# Patient Record
Sex: Female | Born: 1965 | Race: White | Hispanic: No | Marital: Married | Smoking: Former smoker
Health system: Southern US, Community
[De-identification: ages and names within clinical notes are randomized; demographics above are authoritative.]

## PROBLEM LIST (undated history)

## (undated) DIAGNOSIS — F99 Mental disorder, not otherwise specified: Secondary | ICD-10-CM

## (undated) DIAGNOSIS — D250 Submucous leiomyoma of uterus: Secondary | ICD-10-CM

## (undated) DIAGNOSIS — N950 Postmenopausal bleeding: Secondary | ICD-10-CM

## (undated) DIAGNOSIS — K8020 Calculus of gallbladder without cholecystitis without obstruction: Secondary | ICD-10-CM

## (undated) DIAGNOSIS — H04129 Dry eye syndrome of unspecified lacrimal gland: Secondary | ICD-10-CM

## (undated) HISTORY — PX: TONSILLECTOMY: SUR1361

## (undated) HISTORY — DX: Mental disorder, not otherwise specified: F99

## (undated) HISTORY — DX: Calculus of gallbladder without cholecystitis without obstruction: K80.20

## (undated) HISTORY — DX: Submucous leiomyoma of uterus: D25.0

## (undated) HISTORY — DX: Postmenopausal bleeding: N95.0

---
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
## 2007-03-06 ENCOUNTER — Ambulatory Visit: Payer: Self-pay | Admitting: Orthopedic Surgery

## 2007-12-28 ENCOUNTER — Encounter: Admission: RE | Admit: 2007-12-28 | Discharge: 2007-12-28 | Payer: Self-pay | Admitting: Gastroenterology

## 2008-01-10 ENCOUNTER — Other Ambulatory Visit: Admission: RE | Admit: 2008-01-10 | Discharge: 2008-01-10 | Payer: Self-pay | Admitting: Obstetrics and Gynecology

## 2009-02-10 ENCOUNTER — Other Ambulatory Visit: Admission: RE | Admit: 2009-02-10 | Discharge: 2009-02-10 | Payer: Self-pay | Admitting: Obstetrics and Gynecology

## 2010-04-29 ENCOUNTER — Other Ambulatory Visit: Admission: RE | Admit: 2010-04-29 | Discharge: 2010-04-29 | Payer: Self-pay | Admitting: Obstetrics and Gynecology

## 2011-06-28 ENCOUNTER — Ambulatory Visit (INDEPENDENT_AMBULATORY_CARE_PROVIDER_SITE_OTHER): Payer: BC Managed Care – PPO | Admitting: General Surgery

## 2011-06-28 ENCOUNTER — Other Ambulatory Visit (INDEPENDENT_AMBULATORY_CARE_PROVIDER_SITE_OTHER): Payer: Self-pay | Admitting: General Surgery

## 2011-06-28 ENCOUNTER — Encounter (INDEPENDENT_AMBULATORY_CARE_PROVIDER_SITE_OTHER): Payer: Self-pay | Admitting: General Surgery

## 2011-06-28 VITALS — BP 128/86 | HR 64 | Temp 97.0°F | Resp 12 | Ht 62.0 in | Wt 151.0 lb

## 2011-06-28 DIAGNOSIS — K8020 Calculus of gallbladder without cholecystitis without obstruction: Secondary | ICD-10-CM

## 2011-06-28 NOTE — Progress Notes (Signed)
Patient ID: Samantha Vincent, female   DOB: 04/11/1966, 46 y.o.   MRN: [id number]  Chief Complaint  Patient presents with  . Other    new pt- eval GB      HPI Samantha Vincent is a 46 y.o. female.  She is referred by Dr. Sharrell Ku for consideration of elective cholecystectomy. Her primary care physician is Dr. Shaune Pollack in [place].  The patient began having episodes of biliary colic in 2009. She characterizes these episodes as epigastric pain which radiates to her back and this was associated nausea and vomiting. Sometimes she gets diarrhea. Between attacks her bowel function is normal. She denies food intolerance. She denies relationship to meals. She has never had any liver disease or abnormal liver function tests.  She was initially evaluated by Dr. Kinnie Scales in 2009 and underwent a gallbladder ultrasound on December 28, 2007. This showed multiple gallstones the largest being 2 cm. Fatty liver was noted. She had an upper endoscopy in 2009 she reports was normal.  She is asymptomatic today. She had a very severe attack recently which lasted one hour and  which was longer than the previous attacks. Dr. Kinnie Scales apparently told her that she was going to continue to have attacks she decided to consider surgical intervention at this point. HPI  Past Medical History  Diagnosis Date  . Gallstones     Past Surgical History  Procedure Date  . Tonsillectomy 1971    Family History  Problem Relation Age of Onset  . Heart disease Father     Social History History  Substance Use Topics  . Smoking status: Former Games developer  . Smokeless tobacco: Not on file   Comment: smoked in college  . Alcohol Use: Yes    No Known Allergies  Current Outpatient Prescriptions  Medication Sig Dispense Refill  . Calcium Carbonate-Vit D-Min (CALTRATE PLUS PO) Take by mouth daily.        . fish oil-omega-3 fatty acids 1000 MG capsule Take 2 g by mouth daily.        Marland Kitchen FLUoxetine (PROZAC) 20 MG tablet Take 20 mg by  mouth daily.        Marland Kitchen ibuprofen (ADVIL,MOTRIN) 200 MG tablet Take 200 mg by mouth every 6 (six) hours as needed.          Review of Systems Review of Systems  Constitutional: Negative for fever, chills and unexpected weight change.  HENT: Negative for hearing loss, congestion, sore throat, trouble swallowing and voice change.   Eyes: Negative for visual disturbance.  Respiratory: Negative for cough and wheezing.   Cardiovascular: Negative for chest pain, palpitations and leg swelling.  Gastrointestinal: Positive for nausea, vomiting and abdominal pain. Negative for diarrhea, constipation, blood in stool, abdominal distention and anal bleeding.  Genitourinary: Negative for hematuria, vaginal bleeding and difficulty urinating.  Musculoskeletal: Negative for arthralgias.  Skin: Negative for rash and wound.  Neurological: Negative for seizures, syncope and headaches.  Hematological: Negative for adenopathy. Does not bruise/bleed easily.  Psychiatric/Behavioral: Negative for confusion.    Blood pressure 128/86, pulse 64, temperature 97 F (36.1 C), temperature source Temporal, resp. rate 12, height 5\' 2"  (1.575 m), weight 151 lb (68.493 kg).  Physical Exam Physical Exam  Constitutional: She is oriented to person, place, and time. She appears well-developed and well-nourished. No distress.  HENT:  Head: Normocephalic and atraumatic.  Nose: Nose normal.  Mouth/Throat: No oropharyngeal exudate.  Eyes: Conjunctivae and EOM are normal. Pupils are equal, round, and reactive  to light. Left eye exhibits no discharge. No scleral icterus.  Neck: Neck supple. No JVD present. No tracheal deviation present. No thyromegaly present.  Cardiovascular: Normal rate, regular rhythm, normal heart sounds and intact distal pulses.   No murmur heard. Pulmonary/Chest: Effort normal and breath sounds normal. No respiratory distress. She has no wheezes. She has no rales. She exhibits no tenderness.  Abdominal:  Soft. Bowel sounds are normal. She exhibits no distension and no mass. There is no tenderness. There is no rebound and no guarding.  Musculoskeletal: She exhibits no edema and no tenderness.  Lymphadenopathy:    She has no cervical adenopathy.  Neurological: She is alert and oriented to person, place, and time. She exhibits normal muscle tone. Coordination normal.  Skin: Skin is warm. No rash noted. She is not diaphoretic. No erythema. No pallor.  Psychiatric: She has a normal mood and affect. Her behavior is normal. Judgment and thought content normal.    Data Reviewed  I have reviewed her ultrasound report and I have reviewed Dr. Marlyce Huge recent office note. Assessment    Chronic cholecystitis with cholelithiasis. I agree that she is having recurrent and accelerating biliary colic. No apparent complications to date.    Plan    Proceed with scheduling of laparoscopic cholecystectomy with cholangiogram.  I have discussed the indications and details of surgery with her. Risks and complications have been outlined, including but not limited to bleeding, infection, conversion to open laparotomy, bile leak, injury to adjacent organs with major reconstructive surgery, cardiac, pulmonary, and thromboembolic problems. She understands these issues. Her questions were answered. She agrees with this plan.  Angelia Mould. Derrell Lolling, M.D., [facility] General and Minimally invasive Surgery Breast and Colorectal Surgery Office:   [phone] Pager:   [phone]        Ernestene Mention 06/28/2011, 11:23 AM

## 2011-06-28 NOTE — Patient Instructions (Signed)
Your symptoms of abdominal pain, nausea, and vomiting are almost certainly due to your gallstones and gallbladder disease. You will be scheduled for a laparoscopic cholecystectomy with cholangiogram in the near future. Please stop taking your fish oil and any blood thinners for 5 days prior to surgery.  Laparoscopic Cholecystectomy Laparoscopic cholecystectomy is surgery to remove the gallbladder. The gallbladder is located slightly to the right of center in the abdomen, behind the liver. It is a concentrating and storage sac for the bile produced in the liver. Bile aids in the digestion and absorption of fats. Gallbladder disease (cholecystitis) is an inflammation of your gallbladder. This condition is usually caused by a buildup of gallstones (cholelithiasis) in your gallbladder. Gallstones can block the flow of bile, resulting in inflammation and pain. In severe cases, emergency surgery may be required. When emergency surgery is not required, you will have time to prepare for the procedure. Laparoscopic surgery is an alternative to open surgery. Laparoscopic surgery usually has a shorter recovery time. Your common bile duct may also need to be examined and explored. Your caregiver will discuss this with you if he or she feels this should be done. If stones are found in the common bile duct, they may be removed. LET YOUR CAREGIVER KNOW ABOUT:  Allergies to food or medicine.   Medicines taken, including vitamins, herbs, eyedrops, over-the-counter medicines, and creams.   Use of steroids (by mouth or creams).   Previous problems with anesthetics or numbing medicines.   History of bleeding problems or blood clots.   Previous surgery.   Other health problems, including diabetes and kidney problems.   Possibility of pregnancy, if this applies.  RISKS AND COMPLICATIONS All surgery is associated with risks. Some problems that may occur following this procedure include:  Infection.   Damage to  the common bile duct, nerves, arteries, veins, or other internal organs such as the stomach or intestines.   Bleeding.   A stone may remain in the common bile duct.  BEFORE THE PROCEDURE  Do not take aspirin for 3 days prior to surgery or blood thinners for 1 week prior to surgery.   Do not eat or drink anything after midnight the night before surgery.   Let your caregiver know if you develop a cold or other infectious problem prior to surgery.   You should be present 60 minutes before the procedure or as directed.  PROCEDURE  You will be given medicine that makes you sleep (general anesthetic). When you are asleep, your surgeon will make several small cuts (incisions) in your abdomen. One of these incisions is used to insert a small, lighted scope (laparoscope) into the abdomen. The laparoscope helps the surgeon see into your abdomen. Carbon dioxide gas will be pumped into your abdomen. The gas allows more room for the surgeon to perform your surgery. Other operating instruments are inserted through the other incisions. Laparoscopic procedures may not be appropriate when:  There is major scarring from previous surgery.   The gallbladder is extremely inflamed.   There are bleeding disorders or unexpected cirrhosis of the liver.   A pregnancy is near term.   Other conditions make the laparoscopic procedure impossible.  If your surgeon feels it is not safe to continue with a laparoscopic procedure, he or she will perform an open abdominal procedure. In this case, the surgeon will make an incision to open the abdomen. This gives the surgeon a larger view and field to work within. This may allow the surgeon  to perform procedures that sometimes cannot be performed with a laparoscope alone. Open surgery has a longer recovery time. AFTER THE PROCEDURE  You will be taken to the recovery area where a nurse will watch and check your progress.   You may be allowed to go home the same day.   Do  not resume physical activities until directed by your caregiver.   You may resume a normal diet and activities as directed.  Document Released: 06/07/2005 Document Revised: 02/17/2011 Document Reviewed: 11/20/2010 [facility] Patient Information 2012 [place], [place].

## 2011-07-30 ENCOUNTER — Encounter (HOSPITAL_COMMUNITY): Payer: Self-pay

## 2011-07-30 ENCOUNTER — Encounter (HOSPITAL_COMMUNITY): Payer: Self-pay | Admitting: Pharmacy Technician

## 2011-07-30 ENCOUNTER — Encounter (HOSPITAL_COMMUNITY)
Admission: RE | Admit: 2011-07-30 | Discharge: 2011-07-30 | Disposition: A | Discharge status: Home / Self Care | Payer: BC Managed Care – PPO | Source: Ambulatory Visit | Attending: General Surgery | Admitting: General Surgery

## 2011-07-30 DIAGNOSIS — H04129 Dry eye syndrome of unspecified lacrimal gland: Secondary | ICD-10-CM

## 2011-07-30 HISTORY — DX: Dry eye syndrome of unspecified lacrimal gland: H04.129

## 2011-07-30 HISTORY — PX: WISDOM TOOTH EXTRACTION: SHX21

## 2011-07-30 LAB — COMPREHENSIVE METABOLIC PANEL
ALT: 13 U/L (ref 0–35)
Alkaline Phosphatase: 87 U/L (ref 39–117)
CO2: 27 mEq/L (ref 19–32)
Chloride: 102 mEq/L (ref 96–112)
GFR calc Af Amer: >90 mL/min (ref >90)
GFR calc non Af Amer: >90 mL/min (ref >90)
Glucose, Bld: 73 mg/dL (ref 70–99)
Potassium: 4.2 mEq/L (ref 3.5–5.1)
Sodium: 135 mEq/L (ref 135–145)

## 2011-07-30 LAB — DIFFERENTIAL
Basophils Absolute: 0 10*3/uL (ref 0.0–0.1)
Eosinophils Absolute: 0.1 10*3/uL (ref 0.0–0.7)
Eosinophils Relative: 2 % (ref 0–5)
Monocytes Absolute: 0.6 10*3/uL (ref 0.1–1.0)

## 2011-07-30 LAB — URINALYSIS, ROUTINE W REFLEX MICROSCOPIC
Bilirubin Urine: NEGATIVE
Glucose, UA: NEGATIVE mg/dL
Ketones, ur: NEGATIVE mg/dL
Leukocytes, UA: NEGATIVE
Nitrite: NEGATIVE
Protein, ur: NEGATIVE mg/dL

## 2011-07-30 LAB — CBC
Hemoglobin: 14 g/dL (ref 12.0–15.0)
RBC: 4.83 MIL/uL (ref 3.87–5.11)
WBC: 5.1 10*3/uL (ref 4.0–10.5)

## 2011-07-30 LAB — HCG, SERUM, QUALITATIVE: Preg, Serum: NEGATIVE

## 2011-07-30 LAB — SURGICAL PCR SCREEN
MRSA, PCR: NEGATIVE
Staphylococcus aureus: NEGATIVE

## 2011-07-30 NOTE — Patient Instructions (Signed)
20 Samantha Vincent  07/30/2011   Your procedure is scheduled on: 08-11-11  Report to [facility] at     0800   AM.  Call this number if you have problems the morning of surgery: [phone]   Remember:   Do not eat food:After Midnight.    Take these medicines the morning of surgery with A SIP OF WATER: Prozac, bring eye drops   Do not wear jewelry, make-up or nail polish.  Do not wear lotions, powders, or perfumes. You may wear deodorant.  Do not shave 48 hours prior to surgery.(no shaving of legs and axilla)  Do not bring valuables to the hospital.  Contacts, dentures or bridgework may not be worn into surgery.  Leave suitcase in the car. After surgery it may be brought to your room.  For patients admitted to the hospital, checkout time is 11:00 AM the day of discharge.   Patients discharged the day of surgery will not be allowed to drive home.  Name and phone number of your driver: spouse  Special Instructions: CHG Shower Use Special Wash: 1/2 bottle night before surgery and 1/2 bottle morning of surgery.(avoid face and vaginal area)   Please read over the following fact sheets that you were given: MRSA Information

## 2011-08-02 MED ORDER — CHLORHEXIDINE GLUCONATE 4 % EX LIQD: 1.0000 "application " | Freq: Once | CUTANEOUS | Status: DC

## 2011-08-10 NOTE — H&P (Signed)
[name]     MRN: [id number]   Description: 46 year old female  Provider: Ernestene Mention, MD  Department: [facility]        Vitals     BP Pulse Temp(Src) Resp Ht Wt    128/86  64  97 F (36.1 C) (Temporal)  12  5\' 2"  (1.575 m)  151 lb (68.493 kg)     BMI - 27.62 kg/m2                 History and Physical   Ernestene Mention, MD  Patient ID: Samantha Vincent, female   DOB: Jul 11, 1965, 46 y.o.   MRN: [id number]    Chief Complaint   Patient presents with   .  Other       new pt- eval GB      HPI Samantha Vincent is a 46 y.o. female.  She is referred by Dr. Sharrell Ku for consideration of elective cholecystectomy. Her primary care physician is Dr. Shaune Pollack in [place].  The patient began having episodes of biliary colic in 2009. She characterizes these episodes as epigastric pain which radiates to her back and this was associated nausea and vomiting. Sometimes she gets diarrhea. Between attacks her bowel function is normal. She denies food intolerance. She denies relationship to meals. She has never had any liver disease or abnormal liver function tests.  She was initially evaluated by Dr. Kinnie Scales in 2009 and underwent a gallbladder ultrasound on December 28, 2007. This showed multiple gallstones the largest being 2 cm. Fatty liver was noted. She had an upper endoscopy in 2009 she reports was normal.  She is asymptomatic today. She had a very severe attack recently which lasted one hour and  which was longer than the previous attacks. Dr. Kinnie Scales apparently told her that she was going to continue to have attacks she decided to consider surgical intervention at this point.     Past Medical History   Diagnosis  Date   .  Gallstones         Past Surgical History   Procedure  Date   .  Tonsillectomy  1971       Family History   Problem  Relation  Age of Onset   .  Heart disease  Father       Social History History   Substance Use Topics   .  Smoking status:   Former Games developer   .  Smokeless tobacco:  Not on file     Comment: smoked in college   .  Alcohol Use:  Yes     No Known Allergies    Current Outpatient Prescriptions   Medication  Sig  Dispense  Refill   .  Calcium Carbonate-Vit D-Min (CALTRATE PLUS PO)  Take by mouth daily.           .  fish oil-omega-3 fatty acids 1000 MG capsule  Take 2 g by mouth daily.           Marland Kitchen  FLUoxetine (PROZAC) 20 MG tablet  Take 20 mg by mouth daily.           Marland Kitchen  ibuprofen (ADVIL,MOTRIN) 200 MG tablet  Take 200 mg by mouth every 6 (six) hours as needed.              ROS Constitutional: Negative for fever, chills and unexpected weight change.  HENT: Negative for hearing loss, congestion, sore throat, trouble swallowing and voice  change.   Eyes: Negative for visual disturbance.  Respiratory: Negative for cough and wheezing.   Cardiovascular: Negative for chest pain, palpitations and leg swelling.  Gastrointestinal: Positive for nausea, vomiting and abdominal pain. Negative for diarrhea, constipation, blood in stool, abdominal distention and anal bleeding.  Genitourinary: Negative for hematuria, vaginal bleeding and difficulty urinating.  Musculoskeletal: Negative for arthralgias.  Skin: Negative for rash and wound.  Neurological: Negative for seizures, syncope and headaches.  Hematological: Negative for adenopathy. Does not bruise/bleed easily.  Psychiatric/Behavioral: Negative for confusion.    Blood pressure 128/86, pulse 64, temperature 97 F (36.1 C), temperature source Temporal, resp. rate 12, height 5\' 2"  (1.575 m), weight 151 lb (68.493 kg).   Physical Exam   Constitutional: She is oriented to person, place, and time. She appears well-developed and well-nourished. No distress.  HENT:   Head: Normocephalic and atraumatic.   Nose: Nose normal.   Mouth/Throat: No oropharyngeal exudate.  Eyes: Conjunctivae and EOM are normal. Pupils are equal, round, and reactive to light. Left eye exhibits no  discharge. No scleral icterus.  Neck: Neck supple. No JVD present. No tracheal deviation present. No thyromegaly present.  Cardiovascular: Normal rate, regular rhythm, normal heart sounds and intact distal pulses.    No murmur heard. Pulmonary/Chest: Effort normal and breath sounds normal. No respiratory distress. She has no wheezes. She has no rales. She exhibits no tenderness.  Abdominal: Soft. Bowel sounds are normal. She exhibits no distension and no mass. There is no tenderness. There is no rebound and no guarding.  Musculoskeletal: She exhibits no edema and no tenderness.  Lymphadenopathy:    She has no cervical adenopathy.  Neurological: She is alert and oriented to person, place, and time. She exhibits normal muscle tone. Coordination normal.  Skin: Skin is warm. No rash noted. She is not diaphoretic. No erythema. No pallor.  Psychiatric: She has a normal mood and affect. Her behavior is normal. Judgment and thought content normal.    Data Reviewed I have reviewed her ultrasound report and I have reviewed Dr. Marlyce Huge recent office note  Assessment Chronic cholecystitis with cholelithiasis. I agree that she is having recurrent and accelerating biliary colic. No apparent complications to date.   Plan Proceed with scheduling of laparoscopic cholecystectomy with cholangiogram.  I have discussed the indications and details of surgery with her. Risks and complications have been outlined, including but not limited to bleeding, infection, conversion to open laparotomy, bile leak, injury to adjacent organs with major reconstructive surgery, cardiac, pulmonary, and thromboembolic problems. She understands these issues. Her questions were answered. She agrees with this plan.   Angelia Mould. Derrell Lolling, M.D., [facility] General and Minimally invasive Surgery Breast and Colorectal Surgery Office:   [phone] Pager:   [phone] 08/10/2011

## 2011-08-11 ENCOUNTER — Ambulatory Visit (HOSPITAL_COMMUNITY): Payer: BC Managed Care – PPO | Admitting: Anesthesiology

## 2011-08-11 ENCOUNTER — Ambulatory Visit (HOSPITAL_COMMUNITY): Payer: BC Managed Care – PPO

## 2011-08-11 ENCOUNTER — Encounter (HOSPITAL_COMMUNITY): Payer: Self-pay | Admitting: Anesthesiology

## 2011-08-11 ENCOUNTER — Ambulatory Visit (HOSPITAL_COMMUNITY)
Admission: RE | Admit: 2011-08-11 | Discharge: 2011-08-11 | Disposition: A | Discharge status: Home / Self Care | Payer: BC Managed Care – PPO | Source: Ambulatory Visit | Attending: General Surgery | Admitting: General Surgery

## 2011-08-11 ENCOUNTER — Encounter (HOSPITAL_COMMUNITY)
Admission: RE | Disposition: A | Discharge status: Home / Self Care | Payer: Self-pay | Source: Ambulatory Visit | Attending: General Surgery

## 2011-08-11 ENCOUNTER — Other Ambulatory Visit (INDEPENDENT_AMBULATORY_CARE_PROVIDER_SITE_OTHER): Payer: Self-pay | Admitting: General Surgery

## 2011-08-11 ENCOUNTER — Encounter (HOSPITAL_COMMUNITY): Payer: Self-pay | Admitting: *Deleted

## 2011-08-11 DIAGNOSIS — M549 Dorsalgia, unspecified: Secondary | ICD-10-CM | POA: Insufficient documentation

## 2011-08-11 DIAGNOSIS — Z01812 Encounter for preprocedural laboratory examination: Secondary | ICD-10-CM | POA: Insufficient documentation

## 2011-08-11 DIAGNOSIS — K8010 Calculus of gallbladder with chronic cholecystitis without obstruction: Secondary | ICD-10-CM

## 2011-08-11 DIAGNOSIS — R112 Nausea with vomiting, unspecified: Secondary | ICD-10-CM | POA: Insufficient documentation

## 2011-08-11 DIAGNOSIS — Z79899 Other long term (current) drug therapy: Secondary | ICD-10-CM | POA: Insufficient documentation

## 2011-08-11 DIAGNOSIS — R1013 Epigastric pain: Secondary | ICD-10-CM | POA: Insufficient documentation

## 2011-08-11 DIAGNOSIS — K8020 Calculus of gallbladder without cholecystitis without obstruction: Secondary | ICD-10-CM | POA: Diagnosis present

## 2011-08-11 HISTORY — PX: CHOLECYSTECTOMY: SHX55

## 2011-08-11 SURGERY — LAPAROSCOPIC CHOLECYSTECTOMY WITH INTRAOPERATIVE CHOLANGIOGRAM
Anesthesia: General | Site: Abdomen | Wound class: Clean Contaminated

## 2011-08-11 MED ORDER — HYDROCODONE-ACETAMINOPHEN 5-325 MG PO TABS: 1.0000 | ORAL_TABLET | ORAL | Status: AC | PRN

## 2011-08-11 MED ORDER — DEXAMETHASONE SODIUM PHOSPHATE 10 MG/ML IJ SOLN
INTRAMUSCULAR | Status: DC | PRN
  Administered 2011-08-11: 10 mg via INTRAVENOUS

## 2011-08-11 MED ORDER — LACTATED RINGERS IR SOLN
Status: DC | PRN
  Administered 2011-08-11: 1000 mL

## 2011-08-11 MED ORDER — ACETAMINOPHEN 650 MG RE SUPP
650.0000 mg | RECTAL | Status: DC | PRN
  Filled 2011-08-11: qty 1

## 2011-08-11 MED ORDER — ACETAMINOPHEN 10 MG/ML IV SOLN
INTRAVENOUS | Status: DC | PRN
  Administered 2011-08-11: 1000 mg via INTRAVENOUS

## 2011-08-11 MED ORDER — SODIUM CHLORIDE 0.9 % IV SOLN: INTRAVENOUS | Status: DC

## 2011-08-11 MED ORDER — PROMETHAZINE HCL 25 MG/ML IJ SOLN: 12.5000 mg | Freq: Four times a day (QID) | INTRAMUSCULAR | Status: DC | PRN

## 2011-08-11 MED ORDER — GLYCOPYRROLATE 0.2 MG/ML IJ SOLN
INTRAMUSCULAR | Status: DC | PRN
  Administered 2011-08-11: .6 mg via INTRAVENOUS

## 2011-08-11 MED ORDER — LIDOCAINE HCL (CARDIAC) 20 MG/ML IV SOLN
INTRAVENOUS | Status: DC | PRN
  Administered 2011-08-11: 100 mg via INTRAVENOUS

## 2011-08-11 MED ORDER — IOHEXOL 300 MG/ML  SOLN
INTRAMUSCULAR | Status: DC | PRN
  Administered 2011-08-11: 11 mL via INTRAVENOUS

## 2011-08-11 MED ORDER — NEOSTIGMINE METHYLSULFATE 1 MG/ML IJ SOLN
INTRAMUSCULAR | Status: DC | PRN
  Administered 2011-08-11: 5 mg via INTRAVENOUS

## 2011-08-11 MED ORDER — ONDANSETRON HCL 4 MG/2ML IJ SOLN
INTRAMUSCULAR | Status: DC | PRN
  Administered 2011-08-11: 4 mg via INTRAVENOUS

## 2011-08-11 MED ORDER — OXYCODONE HCL 5 MG PO TABS: 5.0000 mg | ORAL_TABLET | ORAL | Status: DC | PRN

## 2011-08-11 MED ORDER — CEFAZOLIN SODIUM 1-5 GM-% IV SOLN
1.0000 g | INTRAVENOUS | Status: AC
  Administered 2011-08-11: 1 g via INTRAVENOUS

## 2011-08-11 MED ORDER — HYDROMORPHONE HCL PF 1 MG/ML IJ SOLN
0.2500 mg | INTRAMUSCULAR | Status: DC | PRN
  Administered 2011-08-11 (×5): 0.25 mg via INTRAVENOUS

## 2011-08-11 MED ORDER — 0.9 % SODIUM CHLORIDE (POUR BTL) OPTIME
TOPICAL | Status: DC | PRN
  Administered 2011-08-11: 1000 mL

## 2011-08-11 MED ORDER — PROMETHAZINE HCL 25 MG/ML IJ SOLN
6.2500 mg | INTRAMUSCULAR | Status: DC | PRN
  Administered 2011-08-11: 6.25 mg via INTRAVENOUS

## 2011-08-11 MED ORDER — ACETAMINOPHEN 325 MG PO TABS: 650.0000 mg | ORAL_TABLET | ORAL | Status: DC | PRN

## 2011-08-11 MED ORDER — MIDAZOLAM HCL 5 MG/5ML IJ SOLN
INTRAMUSCULAR | Status: DC | PRN
  Administered 2011-08-11: 2 mg via INTRAVENOUS

## 2011-08-11 MED ORDER — ROCURONIUM BROMIDE 100 MG/10ML IV SOLN
INTRAVENOUS | Status: DC | PRN
  Administered 2011-08-11: 45 mg via INTRAVENOUS

## 2011-08-11 MED ORDER — SODIUM CHLORIDE 0.9 % IV SOLN: 250.0000 mL | INTRAVENOUS | Status: DC | PRN

## 2011-08-11 MED ORDER — SODIUM CHLORIDE 0.9 % IJ SOLN: 3.0000 mL | INTRAMUSCULAR | Status: DC | PRN

## 2011-08-11 MED ORDER — ONDANSETRON HCL 4 MG/2ML IJ SOLN: 4.0000 mg | Freq: Four times a day (QID) | INTRAMUSCULAR | Status: DC | PRN

## 2011-08-11 MED ORDER — BUPIVACAINE-EPINEPHRINE 0.5% -1:200000 IJ SOLN
INTRAMUSCULAR | Status: DC | PRN
  Administered 2011-08-11: 20 mL

## 2011-08-11 MED ORDER — PROPOFOL 10 MG/ML IV BOLUS
INTRAVENOUS | Status: DC | PRN
  Administered 2011-08-11: 180 mg via INTRAVENOUS

## 2011-08-11 MED ORDER — LACTATED RINGERS IV SOLN
INTRAVENOUS | Status: DC | PRN
  Administered 2011-08-11 (×2): via INTRAVENOUS

## 2011-08-11 MED ORDER — SODIUM CHLORIDE 0.9 % IJ SOLN: 3.0000 mL | Freq: Two times a day (BID) | INTRAMUSCULAR | Status: DC

## 2011-08-11 MED ORDER — FENTANYL CITRATE 0.05 MG/ML IJ SOLN
INTRAMUSCULAR | Status: DC | PRN
  Administered 2011-08-11: 100 ug via INTRAVENOUS
  Administered 2011-08-11 (×3): 50 ug via INTRAVENOUS

## 2011-08-11 SURGICAL SUPPLY — 37 items
APPLIER CLIP ROT 10 11.4 M/L (STAPLE) ×2
BENZOIN TINCTURE PRP APPL 2/3 (GAUZE/BANDAGES/DRESSINGS) ×2 IMPLANT
CABLE HIGH FREQUENCY MONO STRZ (ELECTRODE) ×2 IMPLANT
CANISTER SUCTION 2500CC (MISCELLANEOUS) ×2 IMPLANT
CLIP APPLIE ROT 10 11.4 M/L (STAPLE) ×1 IMPLANT
CLOTH BEACON ORANGE TIMEOUT ST (SAFETY) ×2 IMPLANT
COVER MAYO STAND STRL (DRAPES) ×2 IMPLANT
DECANTER SPIKE VIAL GLASS SM (MISCELLANEOUS) ×2 IMPLANT
DERMABOND ADVANCED (GAUZE/BANDAGES/DRESSINGS) ×2
DERMABOND ADVANCED .7 DNX12 (GAUZE/BANDAGES/DRESSINGS) ×2 IMPLANT
DRAPE C-ARM 42X72 X-RAY (DRAPES) ×2 IMPLANT
DRAPE LAPAROSCOPIC ABDOMINAL (DRAPES) ×2 IMPLANT
DRSG TEGADERM 2-3/8X2-3/4 SM (GAUZE/BANDAGES/DRESSINGS) ×2 IMPLANT
ELECT REM PT RETURN 9FT ADLT (ELECTROSURGICAL) ×2
ELECTRODE REM PT RTRN 9FT ADLT (ELECTROSURGICAL) ×1 IMPLANT
GAUZE SPONGE 2X2 8PLY STRL LF (GAUZE/BANDAGES/DRESSINGS) ×1 IMPLANT
GLOVE BIOGEL PI IND STRL 7.0 (GLOVE) ×1 IMPLANT
GLOVE BIOGEL PI INDICATOR 7.0 (GLOVE) ×1
GLOVE EUDERMIC 7 POWDERFREE (GLOVE) ×2 IMPLANT
GOWN STRL NON-REIN LRG LVL3 (GOWN DISPOSABLE) ×2 IMPLANT
GOWN STRL REIN XL XLG (GOWN DISPOSABLE) ×4 IMPLANT
HEMOSTAT SURGICEL 4X8 (HEMOSTASIS) IMPLANT
KIT BASIN OR (CUSTOM PROCEDURE TRAY) ×2 IMPLANT
NS IRRIG 1000ML POUR BTL (IV SOLUTION) ×2 IMPLANT
POUCH SPECIMEN RETRIEVAL 10MM (ENDOMECHANICALS) IMPLANT
SET CHOLANGIOGRAPH MIX (MISCELLANEOUS) ×2 IMPLANT
SET IRRIG TUBING LAPAROSCOPIC (IRRIGATION / IRRIGATOR) ×2 IMPLANT
SOLUTION ANTI FOG 6CC (MISCELLANEOUS) ×2 IMPLANT
SPONGE GAUZE 2X2 STER 10/PKG (GAUZE/BANDAGES/DRESSINGS) ×1
STRIP CLOSURE SKIN 1/2X4 (GAUZE/BANDAGES/DRESSINGS) ×2 IMPLANT
SUT MNCRL AB 4-0 PS2 18 (SUTURE) ×2 IMPLANT
TOWEL OR 17X26 10 PK STRL BLUE (TOWEL DISPOSABLE) ×6 IMPLANT
TRAY LAP CHOLE (CUSTOM PROCEDURE TRAY) ×2 IMPLANT
TROCAR BLADELESS OPT 5 75 (ENDOMECHANICALS) IMPLANT
TROCAR XCEL BLUNT TIP 100MML (ENDOMECHANICALS) ×2 IMPLANT
TROCAR XCEL NON-BLD 11X100MML (ENDOMECHANICALS) IMPLANT
TUBING INSUFFLATION 10FT LAP (TUBING) ×2 IMPLANT

## 2011-08-11 NOTE — Discharge Instructions (Signed)
[facility] ______[facility] °LAPAROSCOPIC SURGERY: POST OP INSTRUCTIONS °Always review your discharge instruction sheet given to you by the facility where your surgery was performed. °IF YOU HAVE DISABILITY OR FAMILY LEAVE FORMS, YOU MUST BRING THEM TO THE OFFICE FOR PROCESSING.   °DO NOT GIVE THEM TO YOUR DOCTOR. ° °1. A prescription for pain medication may be given to you upon discharge.  Take your pain medication as prescribed, if needed.  If narcotic pain medicine is not needed, then you may take acetaminophen (Tylenol) or ibuprofen (Advil) as needed. °2. Take your usually prescribed medications unless otherwise directed. °3. If you need a refill on your pain medication, please contact your pharmacy.  They will contact our office to request authorization. Prescriptions will not be filled after 5pm or on week-ends. °4. You should follow a light diet the first few days after arrival home, such as soup and crackers, etc.  Be sure to include lots of fluids daily. °5. Most patients will experience some swelling and bruising in the area of the incisions.  Ice packs will help.  Swelling and bruising can take several days to resolve.  °6. It is common to experience some constipation if taking pain medication after surgery.  Increasing fluid intake and taking a stool softener (such as Colace) will usually help or prevent this problem from occurring.  A mild laxative (Milk of Magnesia or Miralax) should be taken according to package instructions if there are no bowel movements after 48 hours. °7. Unless discharge instructions indicate otherwise, you may remove your bandages 24-48 hours after surgery, and you may shower at that time.  You may have steri-strips (small skin tapes) in place directly over the incision.  These strips should be left on the skin for 7-10 days.  If your surgeon used skin glue on the incision, you may shower in 24 hours.  The glue will flake off over the next 2-3 weeks.  Any sutures or  staples will be removed at the office during your follow-up visit. °8. ACTIVITIES:  You may resume regular (light) daily activities beginning the next day--such as daily self-care, walking, climbing stairs--gradually increasing activities as tolerated.  You may have sexual intercourse when it is comfortable.  Refrain from any heavy lifting or straining until approved by your doctor. °a. You may drive when you are no longer taking prescription pain medication, you can comfortably wear a seatbelt, and you can safely maneuver your car and apply brakes. °b. RETURN TO WORK:  __________________________________________________________ °9. You should see your doctor in the office for a follow-up appointment approximately 2-3 weeks after your surgery.  Make sure that you call for this appointment within a day or two after you arrive home to insure a convenient appointment time. °10. OTHER INSTRUCTIONS: __________________________________________________________________________________________________________________________ __________________________________________________________________________________________________________________________ °WHEN TO CALL YOUR DOCTOR: °1. Fever over 101.0 °2. Inability to urinate °3. Continued bleeding from incision. °4. Increased pain, redness, or drainage from the incision. °5. Increasing abdominal pain ° °The clinic staff is available to answer your questions during regular business hours.  Please don’t hesitate to call and ask to speak to one of the nurses for clinical concerns.  If you have a medical emergency, go to the nearest emergency room or call 911.  A surgeon from [facility] is always on call at the hospital. °[address], [address], [place], [place]  [address] ? [address] [address], [place], [place]   [address] °[phone] ? [phone] ? FAX [phone] °Web site:   [email or web address] °

## 2011-08-11 NOTE — Anesthesia Postprocedure Evaluation (Signed)
  Anesthesia Post-op Note  Patient: Samantha Vincent  Procedure(s) Performed: Procedure(s) (LRB): LAPAROSCOPIC CHOLECYSTECTOMY WITH INTRAOPERATIVE CHOLANGIOGRAM (N/A)  Patient Location: PACU  Anesthesia Type: General  Level of Consciousness: oriented and sedated  Airway and Oxygen Therapy: Patient Spontanous Breathing  Post-op Pain: mild  Post-op Assessment: Post-op Vital signs reviewed, Patient's Cardiovascular Status Stable, Respiratory Function Stable and Patent Airway  Post-op Vital Signs: stable  Complications: No apparent anesthesia complications

## 2011-08-11 NOTE — Anesthesia Preprocedure Evaluation (Signed)
Anesthesia Evaluation  Patient identified by MRN, date of birth, ID band Patient awake    Reviewed: Allergy & Precautions, H&P , NPO status , Patient's Chart, lab work & pertinent test results, reviewed documented beta blocker date and time   Airway Mallampati: II TM Distance: >3 FB Neck ROM: Full    Dental  (+) Teeth Intact and Dental Advisory Given   Pulmonary neg pulmonary ROS,  clear to auscultation        Cardiovascular neg cardio ROS Regular Normal Denies cardiac symptoms   Neuro/Psych Negative Neurological ROS  Negative Psych ROS   GI/Hepatic Neg liver ROS, gallstones   Endo/Other  Negative Endocrine ROS  Renal/GU negative Renal ROS  Genitourinary negative   Musculoskeletal negative musculoskeletal ROS (+)   Abdominal   Peds negative pediatric ROS (+)  Hematology negative hematology ROS (+)   Anesthesia Other Findings   Reproductive/Obstetrics negative OB ROS                           Anesthesia Physical Anesthesia Plan  ASA: I  Anesthesia Plan: General   Post-op Pain Management:    Induction: Intravenous  Airway Management Planned: Oral ETT  Additional Equipment:   Intra-op Plan:   Post-operative Plan: Extubation in OR  Informed Consent: I have reviewed the patients History and Physical, chart, labs and discussed the procedure including the risks, benefits and alternatives for the proposed anesthesia with the patient or authorized representative who has indicated his/her understanding and acceptance.   Dental advisory given  Plan Discussed with: CRNA and Surgeon  Anesthesia Plan Comments:         Anesthesia Quick Evaluation

## 2011-08-11 NOTE — Transfer of Care (Signed)
Immediate Anesthesia Transfer of Care Note  Patient: Samantha Vincent  Procedure(s) Performed: Procedure(s) (LRB): LAPAROSCOPIC CHOLECYSTECTOMY WITH INTRAOPERATIVE CHOLANGIOGRAM (N/A)  Patient Location: PACU  Anesthesia Type: General  Level of Consciousness: awake, alert  and oriented  Airway & Oxygen Therapy: Patient Spontanous Breathing and Patient connected to face mask oxygen  Post-op Assessment: Report given to PACU RN and Post -op Vital signs reviewed and stable  Post vital signs: Reviewed and stable  Complications: No apparent anesthesia complications

## 2011-08-11 NOTE — Interval H&P Note (Signed)
History and Physical Interval Note:  08/11/2011 9:38 AM  Samantha Vincent  has presented today for surgery, with the diagnosis of cholelilithiasis  The goals of treatment and the various methods of treatment have been discussed with the patient and family. After consideration of risks, benefits and other options for treatment, the patient has consented to  Procedure(s) (LRB): LAPAROSCOPIC CHOLECYSTECTOMY WITH INTRAOPERATIVE CHOLANGIOGRAM (N/A) as a surgical intervention .  The patients' history has been reviewed, patient examined today, no change in status, stable for surgery.  I have reviewed the patients' chart and labs.  Questions were answered to the patient's satisfaction.     Ernestene Mention

## 2011-08-11 NOTE — Op Note (Signed)
Patient Name:           Samantha Vincent   Date of Surgery:        08/11/2011  Pre op Diagnosis:      Chronic cholecystitis with cholelithiasis  Post op Diagnosis:    same  Procedure:                 Laparoscopic cholecystectomy with intraoperative cholangiogram  Surgeon:                     Angelia Mould. Derrell Lolling, M.D., FACS  Assistant:                      Abigail Miyamoto, M.D., FACS  Operative Indications:   Samantha Vincent is a 46 y.o. female. She was referred by Dr. Sharrell Ku for consideration of elective cholecystectomy. Her primary care physician is Dr. Shaune Pollack in [place].  The patient began having episodes of biliary colic in 2009. She characterizes these episodes as epigastric pain which radiates to her back and this was associated nausea and vomiting. Sometimes she gets diarrhea. Between attacks her bowel function is normal. She denies food intolerance. She denies relationship to meals. She has never had any liver disease or abnormal liver function tests.  She was initially evaluated by Dr. Kinnie Scales in 2009 and underwent a gallbladder ultrasound on December 28, 2007. This showed multiple gallstones the largest being 2 cm. Fatty liver was noted. She had an upper endoscopy in 2009 she reports was normal.she had a very severe attack recently that lasted 1 hour and was more severe than previous. She has decided to proceed with cholecystectomy at this time. She was evaluated and counseled as an outpatient by me. She is operated on electively.   Operative Findings:       The gallbladder was chronically inflamed. There were a lot of adhesions to the gallbladder the gallbladder dissection from the bed of the liver was somewhat tedious because of chronic fibrosis. The liver looked healthy. The intraoperative cholangiogram was normal, showing normal intrahepatic and extrahepatic biliary anatomy, no filling defects, and no obstruction with good flow of contrast into the duodenum. The stomach, duodenum,  small intestine, and large intestine were normal to inspection.  Procedure in Detail:          Following the induction of general endotracheal anesthesia the patient's abdomen was prepped prepped and draped in a sterile fashion. Surgical time out was performed. Antibiotics were given. 0.5% Marcaine with epinephrine was used as local infiltration anesthetic. A vertically oriented incision was made at the lower rim of the umbilicus. The fascia was incised in the midline and the abdominal cavity entered under direct vision. An 11 mm Hassan trocar was inserted and secured with a pursestring suture of 0 Vicryl. Pneumoperitoneum was created. Video camera was inserted. An 11 mm trocar was placed in the subxiphoid region and two 5 mm trocars were  placed in the right upper quadrant. The gallbladder fundus was identified and elevated. Adhesions were carefully taken down. I dissected out the cystic duct and cystic artery. Isolated the cystic artery as it went up the wall of the gallbladder, secured it with  metal clips and divided it. This created a large window behind the cystic duct. A cholangiogram catheter was inserted into the cystic duct and a cholangiogram was obtained using the C-arm. The cholangiogram was normal as described above. Cholangiocatheter was removed, the cystic duct was secured with multiple  metal clips and divided. The gallbladder was dissected from its bed with electrocautery placed in a specimen bag and removed through the umbilical port. The operative field was copiously irrigated and inspected. At the completion of the case there was no bleeding and no bile leak at all. We removed all the irrigation fluid and we saw no other abnormalities. The trocars were removed and there was no bleeding from the trocar sites. Pneumoperitoneum was released. The fascia at the umbilicus was closed with 0 Vicryl sutures. There was a little bleeding from the lateral 5 mm trocar site and so I closed both 5 mm trocars  in the right upper quadrant with interrupted suture of 4-0 nylon and then there was no further bleeding. The rest of the skin incisions were closed with subcuticular sutures of 4-0 Monocryl and Dermabond. Patient tolerated the procedure well and went to recovery room stable. There were no complications. Counts correct. EBL 20 cc.     Angelia Mould. Derrell Lolling, M.D., FACS General and Minimally Invasive Surgery Breast and Colorectal Surgery  08/11/2011 11:19 AM

## 2011-08-23 ENCOUNTER — Encounter (HOSPITAL_COMMUNITY): Payer: Self-pay | Admitting: General Surgery

## 2011-09-13 ENCOUNTER — Ambulatory Visit (INDEPENDENT_AMBULATORY_CARE_PROVIDER_SITE_OTHER): Payer: BC Managed Care – PPO | Admitting: General Surgery

## 2011-09-13 ENCOUNTER — Encounter (INDEPENDENT_AMBULATORY_CARE_PROVIDER_SITE_OTHER): Payer: Self-pay | Admitting: General Surgery

## 2011-09-13 VITALS — BP 120/72 | HR 64 | Temp 97.3°F | Resp 16 | Ht 62.0 in | Wt 151.6 lb

## 2011-09-13 DIAGNOSIS — K8020 Calculus of gallbladder without cholecystitis without obstruction: Secondary | ICD-10-CM

## 2011-09-13 NOTE — Progress Notes (Signed)
Subjective:     Patient ID: Samantha Vincent, female   DOB: 1965/11/29, 46 y.o.   MRN: [id number]  HPI This pleasant patient underwent elective laparoscopic cholecystectomy with cholangiogram on August 11, 2011 for histologically confirmed chronic cholecystitis with cholelithiasis. She is able to tolerate a normal diet without any side effects. She had a little bit of diarrhea the first week but that resolved quickly. She has no pain. No wound problems. Review of Systems     Objective:   Physical Exam Patient looks well. She is in no distress.  Adam is soft and nontender. All the trocar sites have healed without any problems.    Assessment:     Chronic cholecystitis with cholelithiasis, uneventful recovery following laparoscopic cholecystectomy with cholangiogram.    Plan:     High-fiber, low-fat diet advised.  No restriction of activities.  Return to see Dr. Derrell Lolling PRN       Angelia Mould. Derrell Lolling, M.D., [facility] General and Minimally invasive Surgery Breast and Colorectal Surgery Office:   [phone] Pager:   [phone]

## 2011-09-13 NOTE — Patient Instructions (Signed)
You have recovered from your gallbladder surgery without any obvious complications. Your final pathology report shows chronic cholecystitis with cholelithiasis, which is what was expected.  You are advised to follow a low fat, high fiber diet.  Return to see Dr. Derrell Lolling if any new problems arise.

## 2011-12-30 ENCOUNTER — Emergency Department (HOSPITAL_COMMUNITY)
Admission: EM | Admit: 2011-12-30 | Discharge: 2011-12-30 | Disposition: A | Discharge status: Home / Self Care | Payer: BC Managed Care – PPO | Attending: Emergency Medicine | Admitting: Emergency Medicine

## 2011-12-30 ENCOUNTER — Encounter (HOSPITAL_COMMUNITY): Payer: Self-pay | Admitting: *Deleted

## 2011-12-30 DIAGNOSIS — W292XXA Contact with other powered household machinery, initial encounter: Secondary | ICD-10-CM | POA: Insufficient documentation

## 2011-12-30 DIAGNOSIS — Y92009 Unspecified place in unspecified non-institutional (private) residence as the place of occurrence of the external cause: Secondary | ICD-10-CM | POA: Insufficient documentation

## 2011-12-30 DIAGNOSIS — Z8249 Family history of ischemic heart disease and other diseases of the circulatory system: Secondary | ICD-10-CM | POA: Insufficient documentation

## 2011-12-30 DIAGNOSIS — Z87891 Personal history of nicotine dependence: Secondary | ICD-10-CM | POA: Insufficient documentation

## 2011-12-30 DIAGNOSIS — S61209A Unspecified open wound of unspecified finger without damage to nail, initial encounter: Secondary | ICD-10-CM | POA: Insufficient documentation

## 2011-12-30 DIAGNOSIS — IMO0002 Reserved for concepts with insufficient information to code with codable children: Secondary | ICD-10-CM

## 2011-12-30 MED ORDER — LIDOCAINE HCL (PF) 2 % IJ SOLN
2.0000 mL | Freq: Once | INTRAMUSCULAR | Status: AC
  Administered 2011-12-30: 2 mL
  Filled 2011-12-30: qty 10

## 2011-12-30 MED ORDER — HYDROCODONE-ACETAMINOPHEN 5-325 MG PO TABS
1.0000 | ORAL_TABLET | Freq: Once | ORAL | Status: DC
  Filled 2011-12-30: qty 1

## 2011-12-30 MED ORDER — LIDOCAINE-EPINEPHRINE (PF) 2 %-1:200000 IJ SOLN
INTRAMUSCULAR | Status: AC
  Filled 2011-12-30: qty 20

## 2011-12-30 NOTE — ED Notes (Signed)
Lac to LIF, cut on puree machine.  Cutting finger

## 2011-12-30 NOTE — ED Notes (Signed)
Pt given Norco by EDPa Idol to be taken at home. Discharge instructions discussed. Pt verbalized understanding.

## 2012-01-01 NOTE — ED Provider Notes (Signed)
History     CSN: [id number]  Arrival date & time 12/30/11  1813   First MD Initiated Contact with Patient 12/30/11 1822      Chief Complaint  Patient presents with  . Laceration    (Consider location/radiation/quality/duration/timing/severity/associated sxs/prior treatment) Patient is a 46 y.o. female presenting with skin laceration. The history is provided by the patient.  Laceration  The incident occurred less than 1 hour ago. The laceration is located on the left hand. The laceration is 1 cm in size. Injury mechanism: cut on her puree machine which accidentally turned on when she was reaching inside. The pain is at a severity of 5/10. The pain is moderate. The pain has been constant since onset. She reports no foreign bodies present. Her tetanus status is UTD (Sustaiined an abrasion on her ankle yesterday,  and her tetanus was updated then!).    Past Medical History  Diagnosis Date  . Gallstones   . Dry eye syndrome 07-30-11    tx. eye drops-Systane and Restasis    Past Surgical History  Procedure Date  . Tonsillectomy 1971  . Wisdom tooth extraction 07-30-11    in dentist office  . Cholecystectomy 08/11/2011    Procedure: LAPAROSCOPIC CHOLECYSTECTOMY WITH INTRAOPERATIVE CHOLANGIOGRAM;  Surgeon: Ernestene Mention, MD;  Location: [facility];  Service: General;  Laterality: N/A;  LAPAROSCOPIC CHOLECYSTECTOMY WITH INTRAOPERATIVE CHOLANGIOGRAM   . Tonsillectomy     Family History  Problem Relation Age of Onset  . Heart disease Father     History  Substance Use Topics  . Smoking status: Former Smoker    Types: Cigarettes    Quit date: 07/29/2001  . Smokeless tobacco: Not on file   Comment: smoked in college  . Alcohol Use: 0.6 oz/week    1 Glasses of wine per week     1 glass winw every other day    OB History    Grav Para Term Preterm Abortions TAB SAB Ect Mult Living                  Review of Systems  Constitutional: Negative for fever and chills.  Skin: Positive  for wound.  Neurological: Negative for weakness and numbness.    Allergies  Review of patient's allergies indicates no known allergies.  Home Medications   Current Outpatient Rx  Name Route Sig Dispense Refill  . CALTRATE PLUS PO Oral Take 1 tablet by mouth daily.     . CYCLOSPORINE 0.05 % OP EMUL Both Eyes Place 1 drop into both eyes 2 (two) times daily.    . OMEGA-3 FATTY ACIDS 1000 MG PO CAPS Oral Take 1 g by mouth daily.     Marland Kitchen FLUOXETINE HCL 20 MG PO TABS Oral Take 20 mg by mouth daily before breakfast.     . IBUPROFEN 200 MG PO TABS Oral Take 400 mg by mouth every 6 (six) hours as needed. Pain     . POLYETHYL GLYCOL-PROPYL GLYCOL 0.4-0.3 % OP SOLN Both Eyes Place 1 drop into both eyes daily as needed. Dry eyes      BP 140/84  Pulse 68  Temp 97.8 F (36.6 C) (Oral)  Resp 16  Ht 5\' 2"  (1.575 m)  Wt 140 lb (63.504 kg)  BMI 25.61 kg/m2  SpO2 100%  LMP 12/13/2011  Physical Exam  Constitutional: She is oriented to person, place, and time. She appears well-developed and well-nourished.  HENT:  Head: Normocephalic.  Cardiovascular: Normal rate.   Pulmonary/Chest: Effort normal.  Neurological: She is alert and oriented to person, place, and time. No sensory deficit.  Skin: Laceration noted.       Very irregular laceration with 2 superficial flaps within linear perpendicular lacerations (2) across distal tuft of left index finger,  Subcutaneous without injury to deeper structure,  Base of wound visualized,  No fb and no bony involvement.  Distal sensation intact.  Wound is hemostatic.    ED Course  Procedures (including critical care time)  Labs Reviewed - No data to display No results found.   1. Laceration     LACERATION REPAIR Performed by: Burgess Amor Authorized by: Burgess Amor Consent: Verbal consent obtained. Risks and benefits: risks, benefits and alternatives were discussed Consent given by: patient Patient identity confirmed: provided demographic  data Prepped and Draped in normal sterile fashion Wound explored  Laceration Location: left index finger tuft  Laceration Length: 1 cm irregular with flaps  No Foreign Bodies seen or palpated  Anesthesia: digital block  Local anesthetic: lidocaine 2% without epinephrine  Anesthetic total: 3 ml  Medial nerve blocked without difficulty.  Lateral nerve block required 3 injections over approx 30 minutes to obtain complete anesthesia of finger  Irrigation method: syringe Amount of cleaning: standard  Skin closure: ethilon 4-0  Number of sutures:4  Technique: simple interrupted - loose suturing over flaps to approximate without puncturing the tenuous flaps.  Patient tolerance: Patient tolerated the procedure well with no immediate complications.  Finger was dressed with xeroform,  Bulky dressing applied   MDM  Discussed with pt  That flaps may or may not maintain - they may slough off over time.  Pt understands.  Encouraged to return here or see pcp for recheck of injury for any concerns such as swelling,  Infection, etc.  Suture removal in 10 days.        Burgess Amor, [place] 01/01/12 1422

## 2012-01-01 NOTE — ED Provider Notes (Signed)
Medical screening examination/treatment/procedure(s) were performed by non-physician practitioner and as supervising physician I was immediately available for consultation/collaboration.   Yedidya Duddy L Linville Decarolis, MD 01/01/12 1437 

## 2012-05-04 ENCOUNTER — Other Ambulatory Visit (HOSPITAL_COMMUNITY)
Admission: RE | Admit: 2012-05-04 | Discharge: 2012-05-04 | Disposition: A | Discharge status: Home / Self Care | Payer: BC Managed Care – PPO | Source: Ambulatory Visit | Attending: Obstetrics and Gynecology | Admitting: Obstetrics and Gynecology

## 2012-05-04 ENCOUNTER — Other Ambulatory Visit: Payer: Self-pay | Admitting: Adult Health

## 2012-05-04 DIAGNOSIS — Z1151 Encounter for screening for human papillomavirus (HPV): Secondary | ICD-10-CM | POA: Insufficient documentation

## 2012-05-04 DIAGNOSIS — Z01419 Encounter for gynecological examination (general) (routine) without abnormal findings: Secondary | ICD-10-CM | POA: Insufficient documentation

## 2013-05-09 ENCOUNTER — Encounter: Payer: Self-pay | Admitting: Adult Health

## 2013-05-09 ENCOUNTER — Ambulatory Visit (INDEPENDENT_AMBULATORY_CARE_PROVIDER_SITE_OTHER): Payer: BC Managed Care – PPO | Admitting: Adult Health

## 2013-05-09 VITALS — BP 110/70 | HR 72 | Ht 61.25 in | Wt 145.8 lb

## 2013-05-09 DIAGNOSIS — Z1212 Encounter for screening for malignant neoplasm of rectum: Secondary | ICD-10-CM

## 2013-05-09 DIAGNOSIS — Z01419 Encounter for gynecological examination (general) (routine) without abnormal findings: Secondary | ICD-10-CM

## 2013-05-09 LAB — HEMOCCULT GUIAC POC 1CARD (OFFICE)

## 2013-05-09 MED ORDER — FLUOXETINE HCL 20 MG PO TABS: 20.0000 mg | ORAL_TABLET | Freq: Every day | ORAL | Status: DC

## 2013-05-09 NOTE — Patient Instructions (Signed)
Physical in 1 year Mammogram yearly Fax labs colonscopy at 49 Keep period calendar

## 2013-05-09 NOTE — Progress Notes (Signed)
Patient ID: Samantha Vincent, female   DOB: 06-02-1966, 47 y.o.   MRN: [id number] History of Present Illness: [name] is a 47 year old white female married in for physical.She is having some ?hot flashes and mood swings at times and her last period was 14 days but she has not missed any and has no pain.She recently lost 20 lbs, but she was trying.Had normal pap with negative HPV 04/2012.   Current Medications, Allergies, Past Medical History, Past Surgical History, Family History and Social History were reviewed in [employer] record.   Past Medical History  Diagnosis Date  . Gallstones   . Dry eye syndrome 07-30-11    tx. eye drops-Systane and Restasis  . Mental disorder     anxiety/depression   Past Surgical History  Procedure Laterality Date  . Tonsillectomy  1971  . Wisdom tooth extraction  07-30-11    in dentist office  . Cholecystectomy  08/11/2011    Procedure: LAPAROSCOPIC CHOLECYSTECTOMY WITH INTRAOPERATIVE CHOLANGIOGRAM;  Surgeon: Ernestene Mention, MD;  Location: [facility];  Service: General;  Laterality: N/A;  LAPAROSCOPIC CHOLECYSTECTOMY WITH INTRAOPERATIVE CHOLANGIOGRAM   . Tonsillectomy    Current outpatient prescriptions:Calcium Carbonate-Vit D-Min (CALTRATE PLUS PO), Take 1 tablet by mouth daily. , Disp: , Rfl: ;  cycloSPORINE (RESTASIS) 0.05 % ophthalmic emulsion, Place 1 drop into both eyes 2 (two) times daily., Disp: , Rfl: ;  fish oil-omega-3 fatty acids 1000 MG capsule, Take 1 g by mouth daily. , Disp: , Rfl:  FLUoxetine (PROZAC) 20 MG tablet, Take 1 tablet (20 mg total) by mouth daily before breakfast., Disp: 30 tablet, Rfl: 11;  ibuprofen (ADVIL,MOTRIN) 200 MG tablet, Take 400 mg by mouth every 6 (six) hours as needed. Pain , Disp: , Rfl: ;  Polyethyl Glycol-Propyl Glycol (SYSTANE) 0.4-0.3 % SOLN, Place 1 drop into both eyes daily as needed. Dry eyes, Disp: , Rfl:   Review of Systems: Patient denies any headaches, blurred vision, shortness of breath, chest pain,  abdominal pain, problems with bowel movements, urination, or intercourse.No joint pain, see HPI for positives.     Physical Exam:BP 110/70  Pulse 72  Ht 5' 1.25" (1.556 m)  Wt 145 lb 12 oz (66.112 kg)  BMI 27.31 kg/m2  LMP 04/13/2013 General:  Well developed, well nourished, no acute distress Skin:  Warm and dry Neck:  Midline trachea, normal thyroid Lungs; Clear to auscultation bilaterally Breast:  No dominant palpable mass, retraction, or nipple discharge Cardiovascular: Regular rate and rhythm Abdomen:  Soft, non tender, no hepatosplenomegaly Pelvic:  External genitalia is normal in appearance.  The vagina is normal in appearance.  The cervix is smooth.  Uterus is felt to be normal size, shape, and contour.  No adnexal masses or tenderness noted. Rectal: Good sphincter tone, no polyps, or hemorrhoids felt.  Hemoccult negative. Extremities:  No swelling or varicosities noted Psych:  Alert and cooperative seems happy Discussed peri menopause symptoms and menopause  Impression: Yearly gyn exam no pap    Plan: Refilled prozac x 1 year Physical in 1 year Mammogram yearly Colonoscopy at 37 Keep calendar of period and call if any concerns, will get [place] if persists

## 2013-11-29 ENCOUNTER — Other Ambulatory Visit: Payer: BC Managed Care – PPO

## 2013-11-29 DIAGNOSIS — Z13220 Encounter for screening for lipoid disorders: Secondary | ICD-10-CM

## 2013-11-29 DIAGNOSIS — Z1329 Encounter for screening for other suspected endocrine disorder: Secondary | ICD-10-CM

## 2013-11-29 DIAGNOSIS — Z0000 Encounter for general adult medical examination without abnormal findings: Secondary | ICD-10-CM

## 2013-11-29 LAB — CBC
HCT: 43.1 % (ref 36.0–46.0)
HEMOGLOBIN: 14.5 g/dL (ref 12.0–15.0)
MCH: 29 pg (ref 26.0–34.0)
MCHC: 33.6 g/dL (ref 30.0–36.0)
MCV: 86.2 fL (ref 78.0–100.0)
PLATELETS: 263 10*3/uL (ref 150–400)
RBC: 5 MIL/uL (ref 3.87–5.11)
RDW: 13.6 % (ref 11.5–15.5)
WBC: 5.1 10*3/uL (ref 4.0–10.5)

## 2013-11-30 LAB — LIPID PANEL
CHOLESTEROL: 166 mg/dL (ref 0–200)
HDL: 77 mg/dL (ref >39)
LDL Cholesterol: 78 mg/dL (ref 0–99)
Total CHOL/HDL Ratio: 2.2 Ratio
Triglycerides: 55 mg/dL (ref <150)
VLDL: 11 mg/dL (ref 0–40)

## 2013-11-30 LAB — COMPREHENSIVE METABOLIC PANEL
ALBUMIN: 4 g/dL (ref 3.5–5.2)
ALT: 15 U/L (ref 0–35)
AST: 18 U/L (ref 0–37)
Alkaline Phosphatase: 81 U/L (ref 39–117)
BUN: 14 mg/dL (ref 6–23)
CALCIUM: 9.3 mg/dL (ref 8.4–10.5)
CHLORIDE: 104 meq/L (ref 96–112)
CO2: 25 mEq/L (ref 19–32)
Creat: 0.7 mg/dL (ref 0.50–1.10)
Glucose, Bld: 79 mg/dL (ref 70–99)
POTASSIUM: 4.9 meq/L (ref 3.5–5.3)
SODIUM: 139 meq/L (ref 135–145)
TOTAL PROTEIN: 7.6 g/dL (ref 6.0–8.3)
Total Bilirubin: 0.5 mg/dL (ref 0.2–1.2)

## 2013-11-30 LAB — TSH: TSH: 2.042 u[IU]/mL (ref 0.350–4.500)

## 2013-12-03 ENCOUNTER — Telehealth: Payer: Self-pay | Admitting: Adult Health

## 2013-12-03 NOTE — Telephone Encounter (Signed)
Pt aware labs are good will mail her a copy

## 2014-04-29 ENCOUNTER — Other Ambulatory Visit: Payer: Self-pay | Admitting: Adult Health

## 2014-05-13 ENCOUNTER — Other Ambulatory Visit: Payer: BC Managed Care – PPO

## 2014-05-13 ENCOUNTER — Ambulatory Visit (INDEPENDENT_AMBULATORY_CARE_PROVIDER_SITE_OTHER): Payer: BC Managed Care – PPO | Admitting: Adult Health

## 2014-05-13 ENCOUNTER — Encounter: Payer: Self-pay | Admitting: Adult Health

## 2014-05-13 VITALS — BP 120/84 | HR 66 | Ht 62.0 in | Wt 144.0 lb

## 2014-05-13 DIAGNOSIS — F411 Generalized anxiety disorder: Secondary | ICD-10-CM | POA: Insufficient documentation

## 2014-05-13 DIAGNOSIS — F419 Anxiety disorder, unspecified: Secondary | ICD-10-CM

## 2014-05-13 DIAGNOSIS — Z1212 Encounter for screening for malignant neoplasm of rectum: Secondary | ICD-10-CM

## 2014-05-13 DIAGNOSIS — Z01419 Encounter for gynecological examination (general) (routine) without abnormal findings: Secondary | ICD-10-CM

## 2014-05-13 DIAGNOSIS — F329 Major depressive disorder, single episode, unspecified: Secondary | ICD-10-CM

## 2014-05-13 DIAGNOSIS — F32A Depression, unspecified: Secondary | ICD-10-CM | POA: Insufficient documentation

## 2014-05-13 LAB — COMPREHENSIVE METABOLIC PANEL
ALBUMIN: 4.2 g/dL (ref 3.5–5.2)
ALT: 15 U/L (ref 0–35)
AST: 18 U/L (ref 0–37)
Alkaline Phosphatase: 85 U/L (ref 39–117)
BUN: 15 mg/dL (ref 6–23)
CALCIUM: 9.5 mg/dL (ref 8.4–10.5)
CHLORIDE: 102 meq/L (ref 96–112)
CO2: 27 meq/L (ref 19–32)
CREATININE: 0.61 mg/dL (ref 0.50–1.10)
Glucose, Bld: 82 mg/dL (ref 70–99)
POTASSIUM: 4.9 meq/L (ref 3.5–5.3)
Sodium: 138 mEq/L (ref 135–145)
Total Bilirubin: 0.6 mg/dL (ref 0.2–1.2)
Total Protein: 7.5 g/dL (ref 6.0–8.3)

## 2014-05-13 LAB — LIPID PANEL
Cholesterol: 168 mg/dL (ref 0–200)
HDL: 72 mg/dL (ref >39)
LDL CALC: 87 mg/dL (ref 0–99)
Total CHOL/HDL Ratio: 2.3 Ratio
Triglycerides: 43 mg/dL (ref <150)
VLDL: 9 mg/dL (ref 0–40)

## 2014-05-13 LAB — CBC
HEMATOCRIT: 43.1 % (ref 36.0–46.0)
HEMOGLOBIN: 14.2 g/dL (ref 12.0–15.0)
MCH: 28.7 pg (ref 26.0–34.0)
MCHC: 32.9 g/dL (ref 30.0–36.0)
MCV: 87.1 fL (ref 78.0–100.0)
MPV: 11 fL (ref 9.4–12.4)
Platelets: 263 10*3/uL (ref 150–400)
RBC: 4.95 MIL/uL (ref 3.87–5.11)
RDW: 13.6 % (ref 11.5–15.5)
WBC: 5 10*3/uL (ref 4.0–10.5)

## 2014-05-13 LAB — HEMOCCULT GUIAC POC 1CARD (OFFICE): FECAL OCCULT BLD: NEGATIVE

## 2014-05-13 LAB — TSH: TSH: 2.473 u[IU]/mL (ref 0.350–4.500)

## 2014-05-13 MED ORDER — FLUOXETINE HCL 20 MG PO CAPS: ORAL_CAPSULE | ORAL | Status: DC

## 2014-05-13 NOTE — Progress Notes (Signed)
Patient ID: Samantha Vincent, female   DOB: October 17, 1965, 48 y.o.   MRN: [id number] History of Present Illness: Samantha Vincent is a 48 year old white female in for gyn exam.Had pap with negative HPV 05/04/12.She is having some hot flashes and no period in 3 months.   Current Medications, Allergies, Past Medical History, Past Surgical History, Family History and Social History were reviewed in [employer] record.     Review of Systems: Patient denies any daily headaches, blurred vision, shortness of breath, chest pain, abdominal pain, problems with bowel movements, urination, or intercourse. No joint pain, moods good with prozac.    Physical Exam:BP 120/84 mmHg  Pulse 66  Ht 5\' 2"  (1.575 m)  Wt 144 lb (65.318 kg)  BMI 26.33 kg/m2 General:  Well developed, well nourished, no acute distress Skin:  Warm and dry Neck:  Midline trachea, normal thyroid Lungs; Clear to auscultation bilaterally Breast:  No dominant palpable mass, retraction, or nipple discharge Cardiovascular: Regular rate and rhythm Abdomen:  Soft, non tender, no hepatosplenomegaly Pelvic:  External genitalia is normal in appearance.  The vagina is normal in appearance. The cervix is smooth.  Uterus is felt to be normal size, shape, and contour.  No adnexal masses or tenderness noted. Rectal: Good sphincter tone, no polyps, or hemorrhoids felt.  Hemoccult negative. Extremities:  No swelling or varicosities noted Psych:  No mood changes,alert and cooperative, seems happy   Impression: Well woman gyn exam no pap Anxiety/depression Peri menopause   Plan: Pap and physical in 1 year Mammogram yearly  Colonoscopy at 60 Check CBC,CMP, TSH and lipids Review handout on peri menopause

## 2014-05-13 NOTE — Patient Instructions (Addendum)
Pap and physical in 1 year Mammogram yearly Colonoscopy at 79 Perimenopause Perimenopause is the time when your body begins to move into the menopause (no menstrual period for 12 straight months). It is a natural process. Perimenopause can begin 2-8 years before the menopause and usually lasts for 1 year after the menopause. During this time, your ovaries may or may not produce an egg. The ovaries vary in their production of estrogen and progesterone hormones each month. This can cause irregular menstrual periods, difficulty getting pregnant, vaginal bleeding between periods, and uncomfortable symptoms. CAUSES  Irregular production of the ovarian hormones, estrogen and progesterone, and not ovulating every month.  Other causes include:  Tumor of the pituitary gland in the brain.  Medical disease that affects the ovaries.  Radiation treatment.  Chemotherapy.  Unknown causes.  Heavy smoking and excessive alcohol intake can bring on perimenopause sooner. SIGNS AND SYMPTOMS   Hot flashes.  Night sweats.  Irregular menstrual periods.  Decreased sex drive.  Vaginal dryness.  Headaches.  Mood swings.  Depression.  Memory problems.  Irritability.  Tiredness.  Weight gain.  Trouble getting pregnant.  The beginning of losing bone cells (osteoporosis).  The beginning of hardening of the arteries (atherosclerosis). DIAGNOSIS  Your health care provider will make a diagnosis by analyzing your age, menstrual history, and symptoms. He or she will do a physical exam and note any changes in your body, especially your female organs. Female hormone tests may or may not be helpful depending on the amount of female hormones you produce and when you produce them. However, other hormone tests may be helpful to rule out other problems. TREATMENT  In some cases, no treatment is needed. The decision on whether treatment is necessary during the perimenopause should be made by you and your  health care provider based on how the symptoms are affecting you and your lifestyle. Various treatments are available, such as:  Treating individual symptoms with a specific medicine for that symptom.  Herbal medicines that can help specific symptoms.  Counseling.  Group therapy. HOME CARE INSTRUCTIONS   Keep track of your menstrual periods (when they occur, how heavy they are, how long between periods, and how long they last) as well as your symptoms and when they started.  Only take over-the-counter or prescription medicines as directed by your health care provider.  Sleep and rest.  Exercise.  Eat a diet that contains calcium (good for your bones) and soy (acts like the estrogen hormone).  Do not smoke.  Avoid alcoholic beverages.  Take vitamin supplements as recommended by your health care provider. Taking vitamin E may help in certain cases.  Take calcium and vitamin D supplements to help prevent bone loss.  Group therapy is sometimes helpful.  Acupuncture may help in some cases. SEEK MEDICAL CARE IF:   You have questions about any symptoms you are having.  You need a referral to a specialist (gynecologist, psychiatrist, or psychologist). SEEK IMMEDIATE MEDICAL CARE IF:   You have vaginal bleeding.  Your period lasts longer than 8 days.  Your periods are recurring sooner than 21 days.  You have bleeding after intercourse.  You have severe depression.  You have pain when you urinate.  You have severe headaches.  You have vision problems. Document Released: 07/15/2004 Document Revised: 03/28/2013 Document Reviewed: 01/04/2013 [facility] Patient Information 2015 [place], [place]. This information is not intended to replace advice given to you by your health care provider. Make sure you discuss any questions you  have with your health care provider.  

## 2014-05-14 ENCOUNTER — Telehealth: Payer: Self-pay | Admitting: Adult Health

## 2014-05-14 NOTE — Telephone Encounter (Signed)
Left message labs look good, will mail her a copy

## 2015-05-26 ENCOUNTER — Ambulatory Visit (INDEPENDENT_AMBULATORY_CARE_PROVIDER_SITE_OTHER): Payer: BLUE CROSS/BLUE SHIELD | Admitting: Adult Health

## 2015-05-26 ENCOUNTER — Other Ambulatory Visit (HOSPITAL_COMMUNITY)
Admission: RE | Admit: 2015-05-26 | Discharge: 2015-05-26 | Disposition: A | Discharge status: Home / Self Care | Payer: Self-pay | Source: Ambulatory Visit | Attending: Adult Health | Admitting: Adult Health

## 2015-05-26 ENCOUNTER — Encounter: Payer: Self-pay | Admitting: Adult Health

## 2015-05-26 VITALS — BP 122/88 | HR 66 | Ht 62.0 in | Wt 159.0 lb

## 2015-05-26 DIAGNOSIS — Z1212 Encounter for screening for malignant neoplasm of rectum: Secondary | ICD-10-CM

## 2015-05-26 DIAGNOSIS — Z01419 Encounter for gynecological examination (general) (routine) without abnormal findings: Secondary | ICD-10-CM

## 2015-05-26 DIAGNOSIS — F329 Major depressive disorder, single episode, unspecified: Secondary | ICD-10-CM

## 2015-05-26 DIAGNOSIS — F419 Anxiety disorder, unspecified: Secondary | ICD-10-CM

## 2015-05-26 DIAGNOSIS — Z1151 Encounter for screening for human papillomavirus (HPV): Secondary | ICD-10-CM | POA: Insufficient documentation

## 2015-05-26 DIAGNOSIS — F32A Depression, unspecified: Secondary | ICD-10-CM

## 2015-05-26 LAB — HEMOCCULT GUIAC POC 1CARD (OFFICE): Fecal Occult Blood, POC: NEGATIVE

## 2015-05-26 MED ORDER — FLUOXETINE HCL 20 MG PO CAPS: ORAL_CAPSULE | ORAL | Status: DC

## 2015-05-26 NOTE — Patient Instructions (Signed)
Physical in 1 year Pap in 3 if normal Mammogram yearly Colonoscopy advised

## 2015-05-26 NOTE — Progress Notes (Signed)
Patient ID: Samantha Vincent, female   DOB: Oct 05, 1965, 49 y.o.   MRN: [id number] History of Present Illness: Samantha Vincent is a 49 year old white female, married in for a well woman gyn exam and pap.She has random hot flashes and periods vary in heaviness.She got flu shot this year. PCP is Samantha Neighbors MD.   Current Medications, Allergies, Past Medical History, Past Surgical History, Family History and Social History were reviewed in [employer] record.     Review of Systems: Patient denies any headaches, hearing loss, fatigue, blurred vision, shortness of breath, chest pain, abdominal pain, problems with bowel movements, urination, or intercourse. No joint pain or mood swings.She is happy with her prozac and may take it every other day.    Physical Exam:BP 122/88 mmHg  Pulse 66  Ht 5\' 2"  (1.575 m)  Wt 159 lb (72.122 kg)  BMI 29.07 kg/m2  LMP 05/13/2015 General:  Well developed, well nourished, no acute distress Skin:  Warm and dry Neck:  Midline trachea, normal thyroid, good ROM, no lymphadenopathy Lungs; Clear to auscultation bilaterally Breast:  No dominant palpable mass, retraction, or nipple discharge Cardiovascular: Regular rate and rhythm Abdomen:  Soft, non tender, no hepatosplenomegaly Pelvic:  External genitalia is normal in appearance, no lesions.  The vagina is normal in appearance. Urethra has no lesions or masses. The cervix is smooth.Pap with HPV performed.  Uterus is felt to be normal size, shape, and contour.  No adnexal masses or tenderness noted.Bladder is non tender, no masses felt. Rectal: Good sphincter tone, no polyps, external hemorrhoid.  Hemoccult negative. Extremities/musculoskeletal:  No swelling or varicosities noted, no clubbing or cyanosis Psych:  No mood changes, alert and cooperative,seems happy   Impression: Well woman gyn exam and pap Anxiety and depression   Plan: Refilled prozac 20 mg take 1 daily x 1 year Mammogram yearly Physical in  1 year, pap in 3 years if normal with negative HPV Fasting labs this week,orders given Check CBC,CMP,TSH and lipids and vitamin D Colonoscopy advised,let me know when wants referral

## 2015-05-28 LAB — CYTOLOGY - PAP

## 2015-05-30 ENCOUNTER — Telehealth: Payer: Self-pay | Admitting: Adult Health

## 2015-05-30 LAB — TSH: TSH: 3.32 u[IU]/mL (ref 0.450–4.500)

## 2015-05-30 LAB — LIPID PANEL
CHOLESTEROL TOTAL: 194 mg/dL (ref 100–199)
Chol/HDL Ratio: 2.6 ratio units (ref 0.0–4.4)
HDL: 75 mg/dL (ref >39)
LDL Calculated: 109 mg/dL — ABNORMAL HIGH (ref 0–99)
Triglycerides: 51 mg/dL (ref 0–149)
VLDL Cholesterol Cal: 10 mg/dL (ref 5–40)

## 2015-05-30 LAB — COMPREHENSIVE METABOLIC PANEL
A/G RATIO: 1.2 (ref 1.1–2.5)
ALBUMIN: 4.4 g/dL (ref 3.5–5.5)
ALT: 18 IU/L (ref 0–32)
AST: 18 IU/L (ref 0–40)
Alkaline Phosphatase: 105 IU/L (ref 39–117)
BUN / CREAT RATIO: 22 (ref 9–23)
BUN: 15 mg/dL (ref 6–24)
Bilirubin Total: 0.3 mg/dL (ref 0.0–1.2)
CALCIUM: 9.6 mg/dL (ref 8.7–10.2)
CO2: 24 mmol/L (ref 18–29)
Chloride: 101 mmol/L (ref 97–106)
Creatinine, Ser: 0.69 mg/dL (ref 0.57–1.00)
GFR, EST AFRICAN AMERICAN: 117 mL/min/{1.73_m2} (ref >59)
GFR, EST NON AFRICAN AMERICAN: 102 mL/min/{1.73_m2} (ref >59)
GLOBULIN, TOTAL: 3.6 g/dL (ref 1.5–4.5)
Glucose: 97 mg/dL (ref 65–99)
POTASSIUM: 5.1 mmol/L (ref 3.5–5.2)
SODIUM: 141 mmol/L (ref 136–144)
TOTAL PROTEIN: 8 g/dL (ref 6.0–8.5)

## 2015-05-30 LAB — CBC
Hematocrit: 44.6 % (ref 34.0–46.6)
Hemoglobin: 14.9 g/dL (ref 11.1–15.9)
MCH: 29.2 pg (ref 26.6–33.0)
MCHC: 33.4 g/dL (ref 31.5–35.7)
MCV: 87 fL (ref 79–97)
PLATELETS: 278 10*3/uL (ref 150–379)
RBC: 5.11 x10E6/uL (ref 3.77–5.28)
RDW: 12.6 % (ref 12.3–15.4)
WBC: 5.8 10*3/uL (ref 3.4–10.8)

## 2015-05-30 LAB — VITAMIN D 25 HYDROXY (VIT D DEFICIENCY, FRACTURES): VIT D 25 HYDROXY: 34.6 ng/mL (ref 30.0–100.0)

## 2015-05-30 NOTE — Telephone Encounter (Signed)
Left message labs excellent

## 2015-10-01 DIAGNOSIS — L700 Acne vulgaris: Secondary | ICD-10-CM | POA: Diagnosis not present

## 2016-02-09 ENCOUNTER — Encounter: Payer: Self-pay | Admitting: Adult Health

## 2016-02-09 DIAGNOSIS — Z803 Family history of malignant neoplasm of breast: Secondary | ICD-10-CM | POA: Diagnosis not present

## 2016-02-09 DIAGNOSIS — Z1231 Encounter for screening mammogram for malignant neoplasm of breast: Secondary | ICD-10-CM | POA: Diagnosis not present

## 2016-06-23 ENCOUNTER — Other Ambulatory Visit: Payer: Self-pay | Admitting: Adult Health

## 2016-06-29 ENCOUNTER — Encounter: Payer: Self-pay | Admitting: Adult Health

## 2016-06-29 ENCOUNTER — Ambulatory Visit (INDEPENDENT_AMBULATORY_CARE_PROVIDER_SITE_OTHER): Payer: BLUE CROSS/BLUE SHIELD | Admitting: Adult Health

## 2016-06-29 VITALS — BP 125/84 | HR 66 | Ht 62.0 in | Wt 160.0 lb

## 2016-06-29 DIAGNOSIS — Z01411 Encounter for gynecological examination (general) (routine) with abnormal findings: Secondary | ICD-10-CM | POA: Diagnosis not present

## 2016-06-29 DIAGNOSIS — F418 Other specified anxiety disorders: Secondary | ICD-10-CM | POA: Diagnosis not present

## 2016-06-29 DIAGNOSIS — F329 Major depressive disorder, single episode, unspecified: Secondary | ICD-10-CM

## 2016-06-29 DIAGNOSIS — Z1212 Encounter for screening for malignant neoplasm of rectum: Secondary | ICD-10-CM | POA: Diagnosis not present

## 2016-06-29 DIAGNOSIS — Z01419 Encounter for gynecological examination (general) (routine) without abnormal findings: Secondary | ICD-10-CM

## 2016-06-29 DIAGNOSIS — N951 Menopausal and female climacteric states: Secondary | ICD-10-CM

## 2016-06-29 DIAGNOSIS — Z1211 Encounter for screening for malignant neoplasm of colon: Secondary | ICD-10-CM

## 2016-06-29 DIAGNOSIS — F32A Depression, unspecified: Secondary | ICD-10-CM

## 2016-06-29 DIAGNOSIS — F419 Anxiety disorder, unspecified: Secondary | ICD-10-CM

## 2016-06-29 LAB — HEMOCCULT GUIAC POC 1CARD (OFFICE): FECAL OCCULT BLD: NEGATIVE

## 2016-06-29 MED ORDER — FLUOXETINE HCL 20 MG PO CAPS
20.0000 mg | ORAL_CAPSULE | Freq: Every day | ORAL | 11 refills | Status: DC
Start: 2016-06-29 — End: 2017-06-24

## 2016-06-29 NOTE — Progress Notes (Signed)
Patient ID: Samantha Vincent, female   DOB: 12/30/1965, 51 y.o.   MRN: [id number] History of Present Illness:  Samantha Vincent is a 51 year old white female,married in for well woman gyn exam, she had a normal pap with negative HPV 05/26/15. PCP is Zack Hall,MD.   Current Medications, Allergies, Past Medical History, Past Surgical History, Family History and Social History were reviewed in [employer] record.     Review of Systems: Patient denies any headaches, hearing loss, fatigue, blurred vision, shortness of breath, chest pain, abdominal pain, problems with bowel movements, urination, or intercourse. No mood swings.Hips ache some. No period in about 6 months and some hot flashes.    Physical Exam:BP 125/84 (BP Location: Left Arm, Patient Position: Sitting, Cuff Size: Normal)   Pulse 66   Ht 5\' 2"  (1.575 m)   Wt 160 lb (72.6 kg)   BMI 29.26 kg/m  General:  Well developed, well nourished, no acute distress Skin:  Warm and dry Neck:  Midline trachea, normal thyroid, good ROM, no lymphadenopathy Lungs; Clear to auscultation bilaterally Breast:  No dominant palpable mass, retraction, or nipple discharge Cardiovascular: Regular rate and rhythm Abdomen:  Soft, non tender, no hepatosplenomegaly Pelvic:  External genitalia is normal in appearance, no lesions.  The vagina is normal in appearance, with some loss of rugae. Urethra has no lesions or masses. The cervix is smooth.  Uterus is felt to be normal size, shape, and contour.  No adnexal masses or tenderness noted.Bladder is non tender, no masses felt. Rectal: Good sphincter tone, no polyps, or hemorrhoids felt.  Hemoccult negative. Extremities/musculoskeletal:  No swelling or varicosities noted, no clubbing or cyanosis Psych:  No mood changes, alert and cooperative,seems happy PHQ 2 score 0  Impression: 1. Well woman exam with routine gynecological exam   2. Anxiety and depression   3. Menopausal state   4. Screening for  colorectal cancer       Plan: Refilled prozac 20 mg #30 take 1 daily with 11 refills Physical in 1 year Pap in 2019 Mammogram yearly Colonoscopy advised Labs next year

## 2016-12-21 DIAGNOSIS — Z0000 Encounter for general adult medical examination without abnormal findings: Secondary | ICD-10-CM | POA: Diagnosis not present

## 2016-12-28 DIAGNOSIS — Z23 Encounter for immunization: Secondary | ICD-10-CM | POA: Diagnosis not present

## 2016-12-28 DIAGNOSIS — Z0000 Encounter for general adult medical examination without abnormal findings: Secondary | ICD-10-CM | POA: Diagnosis not present

## 2017-01-10 ENCOUNTER — Telehealth: Payer: Self-pay | Admitting: Adult Health

## 2017-01-10 NOTE — Telephone Encounter (Signed)
y called stating that she has not had a period in over a year and today she started spotting, Pt would like to know if that is normal. Please contact pt

## 2017-01-10 NOTE — Telephone Encounter (Signed)
Left message x 1. Needs GYN [place] per [place]. [place]

## 2017-01-11 ENCOUNTER — Other Ambulatory Visit: Payer: Self-pay | Admitting: Adult Health

## 2017-01-11 DIAGNOSIS — N950 Postmenopausal bleeding: Secondary | ICD-10-CM

## 2017-01-11 NOTE — Telephone Encounter (Signed)
Spoke with pt. Call was transferred to front desk to schedule GYN [place] for this week hopefully for postmenopausal bleeding per JAG. [place]

## 2017-01-12 ENCOUNTER — Ambulatory Visit (INDEPENDENT_AMBULATORY_CARE_PROVIDER_SITE_OTHER): Payer: BLUE CROSS/BLUE SHIELD

## 2017-01-12 DIAGNOSIS — N950 Postmenopausal bleeding: Secondary | ICD-10-CM

## 2017-01-12 NOTE — Progress Notes (Signed)
PELVIC US TA/TV:enlarged heterogeneous anteverted uterus w/mult.fibroids,(#1) anterior submucosal fibroid 3.2 x 2 x  2.6 cm, (#2)posterior subserosal 2.1 x 1.4 x 2.2 cm,(#3) posterior subserosal 2.2 x 1.5 x 2.3 cm,EEC 4.2 cm,endometrium appears to be distorted by the anterior fibroid,unable to slide right ovary,left ovary appears to be mobile,no free fluid,no pain during ultrasound

## 2017-01-13 ENCOUNTER — Encounter: Payer: Self-pay | Admitting: Adult Health

## 2017-01-13 ENCOUNTER — Telehealth: Payer: Self-pay | Admitting: Adult Health

## 2017-01-13 DIAGNOSIS — D250 Submucous leiomyoma of uterus: Secondary | ICD-10-CM

## 2017-01-13 DIAGNOSIS — N950 Postmenopausal bleeding: Secondary | ICD-10-CM

## 2017-01-13 HISTORY — DX: Submucous leiomyoma of uterus: D25.0

## 2017-01-13 HISTORY — DX: Postmenopausal bleeding: N95.0

## 2017-01-13 NOTE — Telephone Encounter (Signed)
Pt aware US showed EEC if 4.32mm and submucosal fibroid of 3 cm distorting endometrium, will get endometrial biopsy with Dr Elonda Husky, discussed with him.

## 2017-02-01 ENCOUNTER — Encounter: Payer: Self-pay | Admitting: Obstetrics & Gynecology

## 2017-02-01 ENCOUNTER — Ambulatory Visit (INDEPENDENT_AMBULATORY_CARE_PROVIDER_SITE_OTHER): Payer: BLUE CROSS/BLUE SHIELD | Admitting: Obstetrics & Gynecology

## 2017-02-01 VITALS — BP 110/80 | HR 72 | Wt 162.0 lb

## 2017-02-01 DIAGNOSIS — N950 Postmenopausal bleeding: Secondary | ICD-10-CM | POA: Diagnosis not present

## 2017-02-01 MED ORDER — MEDROXYPROGESTERONE ACETATE 10 MG PO TABS: 10.0000 mg | ORAL_TABLET | Freq: Every day | ORAL | 2 refills | Status: DC

## 2017-02-01 NOTE — Progress Notes (Signed)
Follow up appointment for results  Chief Complaint  Patient presents with  . endometrial biopsy    ultrasound done 01-10-17/ had spotting x 4 days    Blood pressure 110/80, pulse 72, weight 162 lb (73.5 kg).    GYNECOLOGIC SONOGRAM   Samantha Vincent is a 51 y.o. G0P0000 she is here for a pelvic sonogram for postmenopausal bleeding.  Uterus                      9.3 x 6 x 6.87 cm, vol 200 ml  heterogeneous anteverted uterus w/mult.fibroids  Endometrium          4.2 mm, symmetrical, endometrium appears to be distorted by the anterior fibroid  Right ovary             2.1 x 2.4 x 1 cm, wnl,unable to slide right ovary  Left ovary                2 x 1 x 1.8 cm, wnl,appears mobile  No free fluid   Fibroids                   (#1) anterior submucosal fibroid 3.2 x 2 x  2.6 cm, (#2)posterior subserosal 2.1 x 1.4 x 2.2 cm,(#3) posterior subserosal 2.2 x 1.5 x 2.3 cm   Technician Comments:  PELVIC US TA/TV: heterogeneous anteverted uterus w/mult.fibroids,(#1) anterior submucosal fibroid 3.2 x 2 x  2.6 cm, (#2)posterior subserosal 2.1 x 1.4 x 2.2 cm,(#3) posterior subserosal 2.2 x 1.5 x 2.3 cm,EEC 4.2 cm,endometrium appears to be distorted by the anterior fibroid,unable to slide right ovary,left ovary appears to be mobile,no free fluid,no pain during ultrasound    [employer] 01/12/2017 9:49 AM  Clinical Impression and recommendations:  I have reviewed the sonogram results above, combined with the patient's current clinical course, below are my impressions and any appropriate recommendations for management based on the sonographic findings.  Uterus is enlarged from several relatively small myomata Endometrium is normal for a post/peri menopausal woman Ovaries normal with no adnexal pathology  No endometrial sampling is needed at this time Recommend endometrial cycling and re evaluation of the endometrium at that [place] ordered this  encounter: Meds ordered this encounter  Medications  . medroxyPROGESTERone (PROVERA) 10 MG tablet    Sig: Take 1 tablet (10 mg total) by mouth daily. As directed    Dispense:  30 tablet    Refill:  2    Orders for this encounter: No orders of the defined types were placed in this encounter.   Impression: Perimenopausal bleeding: thin endometrium with distorting myomata  Plan: I discussed options with the [place] And certainly originally had thought that endometrial biopsy would be indicated 1 again evaluated the entire situation she has a very thin endometrium I do think she's still perimenopausal and I'm opting to do cyclical Provera 10 mg for 10 days per month and see if she has bleeding.  If she does she will stay on the cyclical Provera for about a year and will monitor her bleeding from there  Additionally on repeat her ultrasound in 3-1/2-4 months so I can evaluate her endometrial changes taking the cyclical Provera  She will begin the Provera in September 1 or September 10 etc. and I'll see her back probably after Thanksgiving for her repeat sonogram  She is instructed she may have bleeding when she finishes her Provera each time it shouldn't  be anything heavy or she has bleeding at any other times she should call me  Follow Up: Return in about 4 months (around 06/03/2017) for GYN sono, Follow up, with Dr Elonda Husky.       Face to face time:  15 minutes  Greater than 50% of the visit time was spent in counseling and coordination of care with the patient.  The summary and outline of the counseling and care coordination is summarized in the note above.   All questions were answered.  Past Medical History:  Diagnosis Date  . Dry eye syndrome 07-30-11   tx. eye drops-Systane and Restasis  . Fibroids, submucosal 01/13/2017  . Gallstones   . Mental disorder    anxiety/depression  . PMB (postmenopausal bleeding) 01/13/2017    Past Surgical History:  Procedure Laterality Date   . CHOLECYSTECTOMY  08/11/2011   Procedure: LAPAROSCOPIC CHOLECYSTECTOMY WITH INTRAOPERATIVE CHOLANGIOGRAM;  Surgeon: Adin Hector, MD;  Location: [facility];  Service: General;  Laterality: N/A;  LAPAROSCOPIC CHOLECYSTECTOMY WITH INTRAOPERATIVE CHOLANGIOGRAM   . TONSILLECTOMY  1971  . TONSILLECTOMY    . WISDOM TOOTH EXTRACTION  07-30-11   in dentist office    OB History    Gravida Para Term Preterm AB Living   0 0 0 0 0 0   SAB TAB Ectopic Multiple Live Births   0 0 0 0        No Known Allergies  Social History   Social History  . Marital status: Married    Spouse name: N/A  . Number of children: N/A  . Years of education: N/A   Social History Main Topics  . Smoking status: Former Smoker    Types: Cigarettes    Quit date: 07/29/2001  . Smokeless tobacco: Never Used     Comment: smoked in college  . Alcohol use 0.6 oz/week    1 Glasses of wine per week     Comment: 1 glass wine every other day  . Drug use: No  . Sexual activity: Yes    Birth control/ protection: None   Other Topics Concern  . None   Social History Narrative  . None    Family History  Problem Relation Age of Onset  . Heart disease Father   . Heart disease Maternal Grandfather

## 2017-02-16 DIAGNOSIS — Z1231 Encounter for screening mammogram for malignant neoplasm of breast: Secondary | ICD-10-CM | POA: Diagnosis not present

## 2017-03-08 ENCOUNTER — Telehealth: Payer: Self-pay | Admitting: Adult Health

## 2017-03-08 NOTE — Telephone Encounter (Signed)
Patient called stating that she would like a call back from [facility] regarding some medication she is taking. Please contact pt

## 2017-03-08 NOTE — Telephone Encounter (Signed)
Left message x 1. JSY 

## 2017-03-09 NOTE — Telephone Encounter (Signed)
Spoke with Samantha Vincent. Samantha Vincent was put on Provera by Dr. Elonda Husky. She took it the first 10 days of September. Due to take again Oct. 1. Samantha Vincent don't want to continue med. Her face is broke out. Samantha Vincent states Dr. Elonda Husky started her on this med instead of doing a biopsy. Samantha Vincent feels like she wants to go ahead with biopsy. Please advise. Thanks!! [place]

## 2017-03-09 NOTE — Telephone Encounter (Signed)
Samantha Vincent tookk provera in September and di not have withdrawal bleed, but was moody and face broke out, does not want to take this months, wants to go ahead and get endometrial biopsy and have it behind her, will make appt. With Dr Elonda Husky for bx.

## 2017-03-22 ENCOUNTER — Other Ambulatory Visit: Payer: Self-pay | Admitting: Obstetrics & Gynecology

## 2017-03-22 ENCOUNTER — Ambulatory Visit (INDEPENDENT_AMBULATORY_CARE_PROVIDER_SITE_OTHER): Payer: BLUE CROSS/BLUE SHIELD | Admitting: Obstetrics & Gynecology

## 2017-03-22 VITALS — BP 110/64 | HR 64 | Ht 62.0 in | Wt 156.0 lb

## 2017-03-22 DIAGNOSIS — N950 Postmenopausal bleeding: Secondary | ICD-10-CM

## 2017-03-22 NOTE — Addendum Note (Signed)
Addended by: Christiana Pellant A on: 03/22/2017 04:18 PM   Modules accepted: Orders

## 2017-03-22 NOTE — Progress Notes (Signed)
Endometrial Biopsy Procedure Note  Pre-operative Diagnosis: post menopausal bleeding with minimally thickened endometrial stripe  Post-operative Diagnosis: normal  Indications: as above  Procedure Details   Urine pregnancy test was not done.  The risks (including infection, bleeding, pain, and uterine perforation) and benefits of the procedure were explained to the patient and Written informed consent was obtained.  Antibiotic prophylaxis against endocarditis was not indicated.   The patient was placed in the dorsal lithotomy position.  Bimanual exam showed the uterus to be in the neutral position.  A Graves' speculum inserted in the vagina, and the cervix prepped with povidone iodine.  Endocervical curettage with a Kevorkian curette was not performed.   A sharp tenaculum was applied to the anterior lip of the cervix for stabilization.  A sterile uterine sound was used to sound the uterus to a depth of 5.5cm.  A Mylex 31mm curette was used to sample the endometrium.  Sample was sent for pathologic examination.  Condition: Stable  Complications: None  Plan:  The patient was advised to call for any fever or for prolonged or severe pain or bleeding. She was advised to use OTC ibuprofen as needed for mild to moderate pain. She was advised to avoid vaginal intercourse for 48 hours or until the bleeding has completely stopped.  Attending Physician Documentation: I was present for or performed the following: endometrial biopsy  Will call/MyChart communicate with patient regarding results

## 2017-04-06 DIAGNOSIS — Z23 Encounter for immunization: Secondary | ICD-10-CM | POA: Diagnosis not present

## 2017-05-23 ENCOUNTER — Other Ambulatory Visit: Payer: Self-pay | Admitting: Obstetrics & Gynecology

## 2017-05-23 DIAGNOSIS — N950 Postmenopausal bleeding: Secondary | ICD-10-CM

## 2017-05-24 ENCOUNTER — Other Ambulatory Visit: Payer: BLUE CROSS/BLUE SHIELD

## 2017-05-24 ENCOUNTER — Ambulatory Visit: Payer: BLUE CROSS/BLUE SHIELD | Admitting: Obstetrics & Gynecology

## 2017-06-24 ENCOUNTER — Other Ambulatory Visit: Payer: Self-pay | Admitting: Adult Health

## 2017-12-28 DIAGNOSIS — E669 Obesity, unspecified: Secondary | ICD-10-CM | POA: Diagnosis not present

## 2017-12-28 DIAGNOSIS — R5381 Other malaise: Secondary | ICD-10-CM | POA: Diagnosis not present

## 2017-12-28 DIAGNOSIS — R5383 Other fatigue: Secondary | ICD-10-CM | POA: Diagnosis not present

## 2017-12-28 DIAGNOSIS — Z6830 Body mass index (BMI) 30.0-30.9, adult: Secondary | ICD-10-CM | POA: Diagnosis not present

## 2018-01-26 DIAGNOSIS — Z0000 Encounter for general adult medical examination without abnormal findings: Secondary | ICD-10-CM | POA: Diagnosis not present

## 2018-01-26 DIAGNOSIS — E669 Obesity, unspecified: Secondary | ICD-10-CM | POA: Diagnosis not present

## 2018-01-26 DIAGNOSIS — Z6830 Body mass index (BMI) 30.0-30.9, adult: Secondary | ICD-10-CM | POA: Diagnosis not present

## 2018-01-26 DIAGNOSIS — Z131 Encounter for screening for diabetes mellitus: Secondary | ICD-10-CM | POA: Diagnosis not present

## 2018-01-26 DIAGNOSIS — R5383 Other fatigue: Secondary | ICD-10-CM | POA: Diagnosis not present

## 2018-01-26 DIAGNOSIS — R5381 Other malaise: Secondary | ICD-10-CM | POA: Diagnosis not present

## 2018-02-01 DIAGNOSIS — A770 Spotted fever due to Rickettsia rickettsii: Secondary | ICD-10-CM | POA: Diagnosis not present

## 2018-02-01 DIAGNOSIS — K760 Fatty (change of) liver, not elsewhere classified: Secondary | ICD-10-CM | POA: Diagnosis not present

## 2018-02-01 DIAGNOSIS — E6609 Other obesity due to excess calories: Secondary | ICD-10-CM | POA: Diagnosis not present

## 2018-02-01 DIAGNOSIS — Z0000 Encounter for general adult medical examination without abnormal findings: Secondary | ICD-10-CM | POA: Diagnosis not present

## 2018-02-21 DIAGNOSIS — Z1231 Encounter for screening mammogram for malignant neoplasm of breast: Secondary | ICD-10-CM | POA: Diagnosis not present

## 2018-03-06 DIAGNOSIS — E6609 Other obesity due to excess calories: Secondary | ICD-10-CM | POA: Diagnosis not present

## 2018-03-06 DIAGNOSIS — A770 Spotted fever due to Rickettsia rickettsii: Secondary | ICD-10-CM | POA: Diagnosis not present

## 2018-03-06 DIAGNOSIS — R5381 Other malaise: Secondary | ICD-10-CM | POA: Diagnosis not present

## 2018-03-06 DIAGNOSIS — R5383 Other fatigue: Secondary | ICD-10-CM | POA: Diagnosis not present

## 2018-03-06 DIAGNOSIS — E669 Obesity, unspecified: Secondary | ICD-10-CM | POA: Diagnosis not present

## 2018-03-06 DIAGNOSIS — Z6830 Body mass index (BMI) 30.0-30.9, adult: Secondary | ICD-10-CM | POA: Diagnosis not present

## 2018-03-06 DIAGNOSIS — Z713 Dietary counseling and surveillance: Secondary | ICD-10-CM | POA: Diagnosis not present

## 2018-03-06 DIAGNOSIS — Z6829 Body mass index (BMI) 29.0-29.9, adult: Secondary | ICD-10-CM | POA: Diagnosis not present

## 2018-03-09 ENCOUNTER — Encounter (INDEPENDENT_AMBULATORY_CARE_PROVIDER_SITE_OTHER): Payer: Self-pay | Admitting: *Deleted

## 2018-03-13 ENCOUNTER — Ambulatory Visit: Payer: Self-pay

## 2018-04-04 DIAGNOSIS — Z23 Encounter for immunization: Secondary | ICD-10-CM | POA: Diagnosis not present

## 2018-04-06 ENCOUNTER — Other Ambulatory Visit (INDEPENDENT_AMBULATORY_CARE_PROVIDER_SITE_OTHER): Payer: Self-pay | Admitting: *Deleted

## 2018-04-06 DIAGNOSIS — Z1211 Encounter for screening for malignant neoplasm of colon: Secondary | ICD-10-CM | POA: Insufficient documentation

## 2018-04-25 DIAGNOSIS — M546 Pain in thoracic spine: Secondary | ICD-10-CM | POA: Diagnosis not present

## 2018-04-25 DIAGNOSIS — M542 Cervicalgia: Secondary | ICD-10-CM | POA: Diagnosis not present

## 2018-04-25 DIAGNOSIS — M9902 Segmental and somatic dysfunction of thoracic region: Secondary | ICD-10-CM | POA: Diagnosis not present

## 2018-04-25 DIAGNOSIS — M9901 Segmental and somatic dysfunction of cervical region: Secondary | ICD-10-CM | POA: Diagnosis not present

## 2018-06-23 ENCOUNTER — Encounter (INDEPENDENT_AMBULATORY_CARE_PROVIDER_SITE_OTHER): Payer: Self-pay | Admitting: *Deleted

## 2018-06-23 ENCOUNTER — Telehealth (INDEPENDENT_AMBULATORY_CARE_PROVIDER_SITE_OTHER): Payer: Self-pay | Admitting: *Deleted

## 2018-06-23 NOTE — Telephone Encounter (Signed)
Patient needs suprep 

## 2018-06-26 ENCOUNTER — Telehealth (INDEPENDENT_AMBULATORY_CARE_PROVIDER_SITE_OTHER): Payer: Self-pay | Admitting: *Deleted

## 2018-06-26 NOTE — Telephone Encounter (Signed)
Referring MD/PCP: hall   Procedure: tcs  Reason/Indication:  screening  Has patient had this procedure before?  no  If so, when, by whom and where?    Is there a family history of colon cancer?  no  Who?  What age when diagnosed?    Is patient diabetic?   no      Does patient have prosthetic heart valve or mechanical valve?  no  Do you have a pacemaker?  no  Has patient ever had endocarditis? no  Has patient had joint replacement within last 12 months?  no  Is patient constipated or do they take laxatives? no  Does patient have a history of alcohol/drug use?  no  Is patient on blood thinner such as Coumadin, Plavix and/or Aspirin? no  Medications: prozac 20 mg 1 tab every 3 days, fish oil daily, calcium daily  Allergies: nkda  Medication Adjustment per Dr Lindi Adie, NP:   Procedure date & time: 07/27/18 at 14

## 2018-06-27 MED ORDER — SUPREP BOWEL PREP KIT 17.5-3.13-1.6 GM/177ML PO SOLN: 1.0000 | Freq: Once | ORAL | 0 refills | Status: AC

## 2018-06-27 NOTE — Telephone Encounter (Signed)
agree

## 2018-07-27 ENCOUNTER — Other Ambulatory Visit: Payer: Self-pay

## 2018-07-27 ENCOUNTER — Encounter (HOSPITAL_COMMUNITY): Payer: Self-pay

## 2018-07-27 ENCOUNTER — Encounter (HOSPITAL_COMMUNITY)
Admission: RE | Disposition: A | Discharge status: Home / Self Care | Payer: Self-pay | Source: Home / Self Care | Attending: Internal Medicine

## 2018-07-27 ENCOUNTER — Ambulatory Visit (HOSPITAL_COMMUNITY)
Admission: RE | Admit: 2018-07-27 | Discharge: 2018-07-27 | Disposition: A | Discharge status: Home / Self Care | Payer: BLUE CROSS/BLUE SHIELD | Attending: Internal Medicine | Admitting: Internal Medicine

## 2018-07-27 DIAGNOSIS — F329 Major depressive disorder, single episode, unspecified: Secondary | ICD-10-CM | POA: Insufficient documentation

## 2018-07-27 DIAGNOSIS — Z79899 Other long term (current) drug therapy: Secondary | ICD-10-CM | POA: Diagnosis not present

## 2018-07-27 DIAGNOSIS — Z1211 Encounter for screening for malignant neoplasm of colon: Secondary | ICD-10-CM | POA: Diagnosis not present

## 2018-07-27 DIAGNOSIS — Z793 Long term (current) use of hormonal contraceptives: Secondary | ICD-10-CM | POA: Diagnosis not present

## 2018-07-27 DIAGNOSIS — Z87891 Personal history of nicotine dependence: Secondary | ICD-10-CM | POA: Diagnosis not present

## 2018-07-27 DIAGNOSIS — K644 Residual hemorrhoidal skin tags: Secondary | ICD-10-CM | POA: Diagnosis not present

## 2018-07-27 DIAGNOSIS — Z8249 Family history of ischemic heart disease and other diseases of the circulatory system: Secondary | ICD-10-CM | POA: Diagnosis not present

## 2018-07-27 DIAGNOSIS — Z791 Long term (current) use of non-steroidal anti-inflammatories (NSAID): Secondary | ICD-10-CM | POA: Insufficient documentation

## 2018-07-27 DIAGNOSIS — Z9049 Acquired absence of other specified parts of digestive tract: Secondary | ICD-10-CM | POA: Diagnosis not present

## 2018-07-27 DIAGNOSIS — F419 Anxiety disorder, unspecified: Secondary | ICD-10-CM | POA: Diagnosis not present

## 2018-07-27 HISTORY — PX: COLONOSCOPY: SHX5424

## 2018-07-27 SURGERY — COLONOSCOPY
Anesthesia: Moderate Sedation

## 2018-07-27 MED ORDER — MEPERIDINE HCL 50 MG/ML IJ SOLN
INTRAMUSCULAR | Status: AC
  Filled 2018-07-27: qty 1

## 2018-07-27 MED ORDER — SODIUM CHLORIDE 0.9 % IV SOLN
INTRAVENOUS | Status: DC
  Administered 2018-07-27: 07:00:00 via INTRAVENOUS

## 2018-07-27 MED ORDER — STERILE WATER FOR IRRIGATION IR SOLN
Status: DC | PRN
  Administered 2018-07-27: 08:00:00

## 2018-07-27 MED ORDER — MEPERIDINE HCL 50 MG/ML IJ SOLN
INTRAMUSCULAR | Status: DC | PRN
  Administered 2018-07-27 (×2): 25 mg via INTRAVENOUS

## 2018-07-27 MED ORDER — MIDAZOLAM HCL 5 MG/5ML IJ SOLN
INTRAMUSCULAR | Status: DC | PRN
  Administered 2018-07-27 (×3): 2 mg via INTRAVENOUS

## 2018-07-27 MED ORDER — MIDAZOLAM HCL 5 MG/5ML IJ SOLN
INTRAMUSCULAR | Status: AC
  Filled 2018-07-27: qty 10

## 2018-07-27 NOTE — Op Note (Signed)
[facility] Patient Name: Samantha Vincent Procedure Date: 07/27/2018 7:16 AM MRN: [id number] Date of Birth: Jul 14, 1965 Attending MD: Hildred Laser , MD CSN: [id number] Age: 53 Admit Type: Outpatient Procedure:                Colonoscopy Indications:              Screening for colorectal malignant neoplasm Providers:                Hildred Laser, MD, Gerome Sam, RN, Jeanann Lewandowsky.                            Sharon Seller, RN, Randa Spike, Technician Referring MD:             Delphina Cahill, MD Medicines:                Meperidine 50 mg IV, Midazolam 4 mg IV Complications:            No immediate complications. Estimated Blood Loss:     Estimated blood loss: none. Procedure:                Pre-Anesthesia Assessment:                           - Prior to the procedure, a History and Physical                            was performed, and patient medications and                            allergies were reviewed. The patient's tolerance of                            previous anesthesia was also reviewed. The risks                            and benefits of the procedure and the sedation                            options and risks were discussed with the patient.                            All questions were answered, and informed consent                            was obtained. Prior Anticoagulants: The patient                            last took ibuprofen 3 days prior to the procedure.                            ASA Grade Assessment: I - A normal, healthy                            patient. After reviewing the risks and benefits,  the patient was deemed in satisfactory condition to                            undergo the procedure.                           After obtaining informed consent, the colonoscope                            was passed under direct vision. Throughout the                            procedure, the patient's blood pressure, pulse, and               oxygen saturations were monitored continuously. The                            [id number]-[id number] ([id number] scope was introduced through                            the anus and advanced to the the terminal ileum,                            with identification of the appendiceal orifice and                            IC valve. The colonoscopy was performed without                            difficulty. The patient tolerated the procedure                            well. The quality of the bowel preparation was                            excellent. The terminal ileum, ileocecal valve,                            appendiceal orifice, and rectum were photographed. Scope In: 7:40:01 AM Scope Out: 7:57:57 AM Scope Withdrawal Time: 0 hours 12 minutes 7 seconds  Total Procedure Duration: 0 hours 17 minutes 56 seconds  Findings:      The perianal and digital rectal examinations were normal.      The terminal ileum appeared normal.      The colon (entire examined portion) appeared normal.      External hemorrhoids were found during retroflexion. The hemorrhoids       were small. Impression:               - The examined portion of the ileum was normal.                           - The entire examined colon is normal.                           - External hemorrhoids.                           -  No specimens collected. Moderate Sedation:      Moderate (conscious) sedation was administered by the endoscopy nurse       and supervised by the endoscopist. The following parameters were       monitored: oxygen saturation, heart rate, blood pressure, CO2       capnography and response to care. Total physician intraservice time was       24 minutes. Recommendation:           - Patient has a contact number available for                            emergencies. The signs and symptoms of potential                            delayed complications were discussed with the                            patient.  Return to normal activities tomorrow.                            Written discharge instructions were provided to the                            patient.                           - Resume previous diet today.                           - Continue present medications.                           - Repeat colonoscopy in 10 years for screening                            purposes. Procedure Code(s):        --- Professional ---                           [phone], Colonoscopy, flexible; diagnostic, including                            collection of specimen(s) by brushing or washing,                            when performed (separate procedure)                           99153, Moderate sedation; each additional 15                            minutes intraservice time                           G0500, Moderate sedation services provided by the  same physician or other qualified health care                            professional performing a gastrointestinal                            endoscopic service that sedation supports,                            requiring the presence of an independent trained                            observer to assist in the monitoring of the                            patient's level of consciousness and physiological                            status; initial 15 minutes of intra-service time;                            patient age 37 years or older (additional time may                            be reported with [phone], as appropriate) Diagnosis Code(s):        --- Professional ---                           Z12.11, Encounter for screening for malignant                            neoplasm of colon                           K64.4, Residual hemorrhoidal skin tags CPT copyright 2018 American Medical Association. All rights reserved. The codes documented in this report are preliminary and upon coder review may  be revised to meet current compliance  requirements. Hildred Laser, MD Hildred Laser, MD 07/27/2018 8:06:28 AM This report has been signed electronically. Number of Addenda: 0

## 2018-07-27 NOTE — H&P (Signed)
Samantha Vincent is an 53 y.o. female.   Chief Complaint: Patient is here for colonoscopy. HPI: Patient is 53 year old Caucasian female who is here for screening colonoscopy.  She denies abdominal pain change in medications or rectal bleeding. Last ibuprofen dose was 3 days ago. Family history is negative for CRC.  Past Medical History:  Diagnosis Date  . Dry eye syndrome 07-30-11   tx. eye drops-Systane and Restasis  . Fibroids, submucosal 01/13/2017  . Gallstones   .     anxiety/depression  . PMB (postmenopausal bleeding) 01/13/2017    Past Surgical History:  Procedure Laterality Date  . CHOLECYSTECTOMY  08/11/2011   Procedure: LAPAROSCOPIC CHOLECYSTECTOMY WITH INTRAOPERATIVE CHOLANGIOGRAM;  Surgeon: Adin Hector, MD;  Location: [facility];  Service: General;  Laterality: N/A;  LAPAROSCOPIC CHOLECYSTECTOMY WITH INTRAOPERATIVE CHOLANGIOGRAM   . TONSILLECTOMY  1971  . TONSILLECTOMY    . WISDOM TOOTH EXTRACTION  07-30-11   in dentist office    Family History  Problem Relation Age of Onset  . Heart disease Father   . Heart disease Maternal Grandfather    Social History:  reports that she quit smoking about 17 years ago. Her smoking use included cigarettes. She has never used smokeless tobacco. She reports current alcohol use of about 1.0 standard drinks of alcohol per week. She reports that she does not use drugs.  Allergies: No Known Allergies  Medications Prior to Admission  Medication Sig Dispense Refill  . Calcium Carbonate-Vit D-Min (CALTRATE PLUS PO) Take 1 tablet by mouth daily.     . fish oil-omega-3 fatty acids 1000 MG capsule Take 2 g by mouth daily.     Marland Kitchen FLUoxetine (PROZAC) 20 MG capsule TAKE ONE CAPSULE BY MOUTH ONCE DAILY. (Patient taking differently: Take 20 mg by mouth every other day. ) 30 capsule 6  . ibuprofen (ADVIL,MOTRIN) 200 MG tablet Take 200 mg by mouth every 6 (six) hours as needed for moderate pain.     . medroxyPROGESTERone (PROVERA) 10 MG tablet Take 1 tablet  (10 mg total) by mouth daily. As directed (Patient not taking: Reported on 03/22/2017) 30 tablet 2    No results found for this or any previous visit (from the past 48 hour(s)). No results found.  ROS  Blood pressure 115/78, pulse 60, temperature 97.8 F (36.6 C), temperature source Oral, resp. rate 13, height 5\' 2"  (1.575 m), weight 65.8 kg, SpO2 99 %. Physical Exam  Constitutional: She appears well-developed and well-nourished.  HENT:  Mouth/Throat: Oropharynx is clear and moist.  Eyes: Conjunctivae are normal. No scleral icterus.  Neck: No thyromegaly present.  Cardiovascular: Normal rate, regular rhythm and normal heart sounds.  No murmur heard. Respiratory: Effort normal and breath sounds normal.  GI: Soft. She exhibits no distension and no mass. There is no abdominal tenderness.  Musculoskeletal:        General: No edema.  Lymphadenopathy:    She has no cervical adenopathy.  Neurological: She is alert.  Skin: Skin is warm and dry.     Assessment/Plan Average rescreening colonoscopy.  Hildred Laser, MD 07/27/2018, 7:29 AM

## 2018-07-27 NOTE — Discharge Instructions (Signed)
Resume usual medications and diet as before. No driving for 24 hours. Next screening exam in 10 years.  Colonoscopy, Adult, Care After This sheet gives you information about how to care for yourself after your procedure. Your doctor may also give you more specific instructions. If you have problems or questions, call your doctor. What can I expect after the procedure? After the procedure, it is common to have:  A small amount of blood in your poop for 24 hours.  Some gas.  Mild cramping or bloating in your belly. Follow these instructions at home: General instructions  For the first 24 hours after the procedure: ? Do not drive or use machinery. ? Do not sign important documents. ? Do not drink alcohol. ? Do your daily activities more slowly than normal. ? Eat foods that are soft and easy to digest.  Take over-the-counter or prescription medicines only as told by your doctor. To help cramping and bloating:   Try walking around.  Put heat on your belly (abdomen) as told by your doctor. Use a heat source that your doctor recommends, such as a moist heat pack or a heating pad. ? Put a towel between your skin and the heat source. ? Leave the heat on for 20-30 minutes. ? Remove the heat if your skin turns bright red. This is especially important if you cannot feel pain, heat, or cold. You can get burned. Eating and drinking   Drink enough fluid to keep your pee (urine) clear or pale yellow.  Return to your normal diet as told by your doctor. Avoid heavy or fried foods that are hard to digest.  Avoid drinking alcohol for as long as told by your doctor. Contact a doctor if:  You have blood in your poop (stool) 2-3 days after the procedure. Get help right away if:  You have more than a small amount of blood in your poop.  You see large clumps of tissue (blood clots) in your poop.  Your belly is swollen.  You feel sick to your stomach (nauseous).  You throw up  (vomit).  You have a fever.  You have belly pain that gets worse, and medicine does not help your pain. Summary  After the procedure, it is common to have a small amount of blood in your poop. You may also have mild cramping and bloating in your belly.  For the first 24 hours after the procedure, do not drive or use machinery, do not sign important documents, and do not drink alcohol.  Get help right away if you have a lot of blood in your poop, feel sick to your stomach, have a fever, or have more belly pain. This information is not intended to replace advice given to you by your health care provider. Make sure you discuss any questions you have with your health care provider. Document Released: 07/10/2010 Document Revised: 04/07/2017 Document Reviewed: 03/01/2016 Elsevier Interactive Patient Education  2019 [employer]  Hemorrhoids Hemorrhoids are swollen veins that may develop:  In the butt (rectum). These are called internal hemorrhoids.  Around the opening of the butt (anus). These are called external hemorrhoids. Hemorrhoids can cause pain, itching, or bleeding. Most of the time, they do not cause serious problems. They usually get better with diet changes, lifestyle changes, and other home treatments. What are the causes? This condition may be caused by:  Having trouble pooping (constipation).  Pushing hard (straining) to poop.  Watery poop (diarrhea).  Pregnancy.  Being very overweight (  obese).  Sitting for long periods of time.  Heavy lifting or other activity that causes you to strain.  Anal sex.  Riding a bike for a long period of time. What are the signs or symptoms? Symptoms of this condition include:  Pain.  Itching or soreness in the butt.  Bleeding from the butt.  Leaking poop.  Swelling in the area.  One or more lumps around the opening of your butt. How is this diagnosed? A doctor can often diagnose this condition by looking at the  affected area. The doctor may also:  Do an exam that involves feeling the area with a gloved hand (digital rectal exam).  Examine the area inside your butt using a small tube (anoscope).  Order blood tests. This may be done if you have lost a lot of blood.  Have you get a test that involves looking inside the colon using a flexible tube with a camera on the end (sigmoidoscopy or colonoscopy). How is this treated? This condition can usually be treated at home. Your doctor may tell you to change what you eat, make lifestyle changes, or try home treatments. If these do not help, procedures can be done to remove the hemorrhoids or make them smaller. These may involve:  Placing rubber bands at the base of the hemorrhoids to cut off their blood supply.  Injecting medicine into the hemorrhoids to shrink them.  Shining a type of light energy onto the hemorrhoids to cause them to fall off.  Doing surgery to remove the hemorrhoids or cut off their blood supply. Follow these instructions at home: Eating and drinking   Eat foods that have a lot of fiber in them. These include whole grains, beans, nuts, fruits, and vegetables.  Ask your doctor about taking products that have added fiber (fibersupplements).  Reduce the amount of fat in your diet. You can do this by: ? Eating low-fat dairy products. ? Eating less red meat. ? Avoiding processed foods.  Drink enough fluid to keep your pee (urine) pale yellow. Managing pain and swelling   Take a warm-water bath (sitz bath) for 20 minutes to ease pain. Do this 3-4 times a day. You may do this in a bathtub or using a portable sitz bath that fits over the toilet.  If told, put ice on the painful area. It may be helpful to use ice between your warm baths. ? Put ice in a plastic bag. ? Place a towel between your skin and the bag. ? Leave the ice on for 20 minutes, 2-3 times a day. General instructions  Take over-the-counter and prescription  medicines only as told by your doctor. ? Medicated creams and medicines may be used as told.  Exercise often. Ask your doctor how much and what kind of exercise is best for you.  Go to the bathroom when you have the urge to poop. Do not wait.  Avoid pushing too hard when you poop.  Keep your butt dry and clean. Use wet toilet paper or moist towelettes after pooping.  Do not sit on the toilet for a long time.  Keep all follow-up visits as told by your doctor. This is important. Contact a doctor if you:  Have pain and swelling that do not get better with treatment or medicine.  Have trouble pooping.  Cannot poop.  Have pain or swelling outside the area of the hemorrhoids. Get help right away if you have:  Bleeding that will not stop. Summary  Hemorrhoids are  swollen veins in the butt or around the opening of the butt.  They can cause pain, itching, or bleeding.  Eat foods that have a lot of fiber in them. These include whole grains, beans, nuts, fruits, and vegetables.  Take a warm-water bath (sitz bath) for 20 minutes to ease pain. Do this 3-4 times a day. This information is not intended to replace advice given to you by your health care provider. Make sure you discuss any questions you have with your health care provider. Document Released: 03/16/2008 Document Revised: 10/27/2017 Document Reviewed: 10/27/2017 Elsevier Interactive Patient Education  2019 [employer].

## 2018-08-02 ENCOUNTER — Encounter (HOSPITAL_COMMUNITY): Payer: Self-pay | Admitting: Internal Medicine

## 2018-09-04 ENCOUNTER — Other Ambulatory Visit: Payer: Self-pay | Admitting: Adult Health

## 2018-09-18 ENCOUNTER — Other Ambulatory Visit: Payer: Self-pay | Admitting: Internal Medicine

## 2018-09-18 DIAGNOSIS — R1011 Right upper quadrant pain: Secondary | ICD-10-CM | POA: Diagnosis not present

## 2018-09-18 DIAGNOSIS — R1010 Upper abdominal pain, unspecified: Secondary | ICD-10-CM

## 2018-10-11 ENCOUNTER — Ambulatory Visit (HOSPITAL_COMMUNITY)
Admission: RE | Admit: 2018-10-11 | Discharge: 2018-10-11 | Disposition: A | Discharge status: Home / Self Care | Payer: BLUE CROSS/BLUE SHIELD | Source: Ambulatory Visit | Attending: Internal Medicine | Admitting: Internal Medicine

## 2018-10-11 ENCOUNTER — Other Ambulatory Visit: Payer: Self-pay

## 2018-10-11 DIAGNOSIS — R109 Unspecified abdominal pain: Secondary | ICD-10-CM | POA: Diagnosis not present

## 2018-10-11 DIAGNOSIS — R1010 Upper abdominal pain, unspecified: Secondary | ICD-10-CM | POA: Insufficient documentation

## 2018-10-11 DIAGNOSIS — Z9049 Acquired absence of other specified parts of digestive tract: Secondary | ICD-10-CM | POA: Diagnosis not present

## 2018-10-19 ENCOUNTER — Encounter (INDEPENDENT_AMBULATORY_CARE_PROVIDER_SITE_OTHER): Payer: Self-pay | Admitting: Internal Medicine

## 2018-10-19 ENCOUNTER — Ambulatory Visit (INDEPENDENT_AMBULATORY_CARE_PROVIDER_SITE_OTHER): Payer: BLUE CROSS/BLUE SHIELD | Admitting: Internal Medicine

## 2018-10-19 ENCOUNTER — Other Ambulatory Visit: Payer: Self-pay

## 2018-10-19 VITALS — BP 119/83 | HR 71 | Temp 97.7°F | Resp 18 | Ht 62.0 in | Wt 150.8 lb

## 2018-10-19 DIAGNOSIS — R1011 Right upper quadrant pain: Secondary | ICD-10-CM | POA: Diagnosis not present

## 2018-10-19 NOTE — Progress Notes (Signed)
Presenting complaint  Right upper quadrant abdominal pain.  History of present illness  Samantha Vincent is 53 year old Caucasian female who has been referred through Dr. Thedore Mins Hall/Ms. Martin Majestic PA for evaluation. I recently saw her for screening colonoscopy on 07/27/2018.  Examination was normal including terminal ileum with exception of external hemorrhoids.  She states she has been having pain in right upper quadrant off and on for 6 months.  She has either become more aware of this pain lately or it has been more pronounced but never intractable or severe.  She had limited right upper quadrant ultrasound on 10/11/2018 and no abnormality was noted.  She is status post cholecystectomy and a bile duct measured 8 mm.  Pain radiates posterolaterally and upwards.  She has not been able to pinpoint any triggers.  This pain is not associated with nausea vomiting diarrhea or urgency.  She also does not have hematuria. When I talk with her over the weekend I wanted her to keep a symptom diary.  She says she has been too busy at work and has not been able to do so but she is off for the next 1 week and will close attention.  She describes this pain to be vague pain.  At times she has pruritus in this area but she is never had a rash or shingles in the past.  Pain may last for several minutes but not all day. 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 She has good appetite and her weight has been stable. She denies chronic back pain but she does have upper back pain if she sits in a chair or couch for long time.  She has noted left arm pain recently when she abducts. She takes Advil OTC no more than 10 or 12 doses per month for musculoskeletal pain.  She has not tried this medication for this pain though.  She is married but does not have any children.  She does not smoke cigarettes.  She has a glass of wine every night and sometimes she may have two.  Both parents are 63  years old.  Father has coronary artery disease and peripheral neuropathy.  Mother has hypertension. She has a brother and sister in good health.   Current Medications: Outpatient Encounter Medications as of 10/19/2018  Medication Sig  . Cholecalciferol (VITAMIN D-3 PO) Take 5,000 Units by mouth.  . fish oil-omega-3 fatty acids 1000 MG capsule Take 2 g by mouth daily.   Marland Kitchen FLUoxetine (PROZAC) 20 MG capsule TAKE ONE CAPSULE BY MOUTH ONCE DAILY.  Marland Kitchen ibuprofen (ADVIL,MOTRIN) 200 MG tablet Take 200 mg by mouth every 6 (six) hours as needed for moderate pain.   . vitamin C (ASCORBIC ACID) 500 MG tablet Take 500 mg by mouth daily.  . [DISCONTINUED] Calcium Carbonate-Vit D-Min (CALTRATE PLUS PO) Take 1 tablet by mouth daily.    No facility-administered encounter medications on file as of 10/19/2018.    Past medical history  Dry eye syndrome History of depression. Uterine fibroids.  Last ultrasound was in July 2018. Status post tonsillectomy in 1971. Cholecystectomy in February 2013. She had screening colonoscopy on 07/27/2018 and was normal other Hemorrhoids.  No Known Allergies  Objective: Blood pressure 119/83, pulse 71, temperature 97.7 F (36.5 C), temperature source Oral, resp. rate 18, height 5\' 2"  (1.575 m), weight 150 lb 12.8 oz (68.4 kg). Patient is alert and in no acute distress. Conjunctiva is pink. Sclera is nonicteric Oropharyngeal  mucosa is normal. No neck masses or thyromegaly noted. She has normal tactile and pressure sensation in both infrascapular areas. Cardiac exam with regular rhythm normal S1 and S2. No murmur or gallop noted. Lungs are clear to auscultation. Abdomen is symmetrical.  She does not have hyperesthesia or tactile or pain sensation in subcostal regions. On palpation abdomen is soft and nontender with organomegaly or masses. No LE edema or clubbing noted.  Labs/studies Results:  Right upper quadrant abdominal ultrasound on 10/11/2018 Reveals bile duct of  8 mm, evidence of Coley cystectomy and normal liver.   Assessment:  Patient is 53 year old Caucasian female who presents with 6 months history of intermittent right subcostal pain radiating into her back on the right side.  He has no associated GI or urinary symptoms.  Pain is described to be vague and at times she has pruritus.  Pain has all the hallmarks of musculoskeletal or neuropathic pain.     Plan:  Patient advised to keep symptom diary until the next office visit in 6 weeks.  She will keep a record when she is pain-free when she has mild or more significant pain and if there is association with physical activity and if she has less pain when she is not working. Will request copy of recent blood work for view. We will try to maintain good posture when working standing or sitting. If pain persist will consider MRI of lower thoracic spine.

## 2018-10-19 NOTE — Patient Instructions (Addendum)
Please keep symptom diary as discussed. Consider regular physical activity including stretching exercises as well as walking at least 3 times a week. Can take Advil/ibuprofen when pain significant but always take with a snack.

## 2018-12-05 ENCOUNTER — Ambulatory Visit (INDEPENDENT_AMBULATORY_CARE_PROVIDER_SITE_OTHER): Payer: BLUE CROSS/BLUE SHIELD | Admitting: Internal Medicine

## 2019-01-30 DIAGNOSIS — E669 Obesity, unspecified: Secondary | ICD-10-CM | POA: Diagnosis not present

## 2019-01-30 DIAGNOSIS — Z0000 Encounter for general adult medical examination without abnormal findings: Secondary | ICD-10-CM | POA: Diagnosis not present

## 2019-02-03 ENCOUNTER — Emergency Department (HOSPITAL_COMMUNITY)
Admission: EM | Admit: 2019-02-03 | Discharge: 2019-02-03 | Disposition: A | Discharge status: Home / Self Care | Payer: BC Managed Care – PPO | Attending: Emergency Medicine | Admitting: Emergency Medicine

## 2019-02-03 ENCOUNTER — Other Ambulatory Visit: Payer: Self-pay

## 2019-02-03 ENCOUNTER — Encounter (HOSPITAL_COMMUNITY): Payer: Self-pay | Admitting: Emergency Medicine

## 2019-02-03 ENCOUNTER — Emergency Department (HOSPITAL_COMMUNITY): Payer: BC Managed Care – PPO

## 2019-02-03 DIAGNOSIS — Z87891 Personal history of nicotine dependence: Secondary | ICD-10-CM | POA: Insufficient documentation

## 2019-02-03 DIAGNOSIS — Z79899 Other long term (current) drug therapy: Secondary | ICD-10-CM | POA: Insufficient documentation

## 2019-02-03 DIAGNOSIS — Y929 Unspecified place or not applicable: Secondary | ICD-10-CM | POA: Diagnosis not present

## 2019-02-03 DIAGNOSIS — Y9389 Activity, other specified: Secondary | ICD-10-CM | POA: Diagnosis not present

## 2019-02-03 DIAGNOSIS — S0990XA Unspecified injury of head, initial encounter: Secondary | ICD-10-CM

## 2019-02-03 DIAGNOSIS — R51 Headache: Secondary | ICD-10-CM | POA: Diagnosis not present

## 2019-02-03 DIAGNOSIS — S199XXA Unspecified injury of neck, initial encounter: Secondary | ICD-10-CM | POA: Diagnosis not present

## 2019-02-03 DIAGNOSIS — Y999 Unspecified external cause status: Secondary | ICD-10-CM | POA: Diagnosis not present

## 2019-02-03 DIAGNOSIS — S134XXA Sprain of ligaments of cervical spine, initial encounter: Secondary | ICD-10-CM | POA: Diagnosis not present

## 2019-02-03 DIAGNOSIS — W010XXA Fall on same level from slipping, tripping and stumbling without subsequent striking against object, initial encounter: Secondary | ICD-10-CM | POA: Diagnosis not present

## 2019-02-03 DIAGNOSIS — S161XXA Strain of muscle, fascia and tendon at neck level, initial encounter: Secondary | ICD-10-CM | POA: Insufficient documentation

## 2019-02-03 NOTE — ED Triage Notes (Signed)
Pt was on a slip and slid and hit the back of her head last night. Pt states " I dont remember it, my family said my eyes were fixated and my speech was slurred"   Pt now has a headache and a "weird feeling" behind right eye

## 2019-02-03 NOTE — ED Notes (Signed)
Patient transported to CT 

## 2019-02-03 NOTE — Discharge Instructions (Signed)
Your CT imaging is negative for any acute injury (no bleeding from trauma in your brain).  Refer to the head injury instructions below.  Avoid any activity that requires prolonged concentration or that worsens your headache and avoid any activity where you might risk a re-injury prior to recovery from this accident.  Plan to see your primary doctor for a recheck of your symptoms if they persist beyond the next week.

## 2019-02-03 NOTE — ED Provider Notes (Signed)
[facility] EMERGENCY DEPARTMENT Provider Note   CSN: [id number] Arrival date & time: 02/03/19  1339    History   Chief Complaint Chief Complaint  Patient presents with   Fall    HPI Samantha Vincent is a 53 y.o. female presenting with a head injury involving syncope occurring around 7 pm yesterday. She was on a slippery slide when she fell, landing on her buttocks, then her posterior head and had an approximate 1 minute LOC (witnessed by family) after which she briefly had slurred speech and "fixed eyes" which she does not recall but  which has resolved.  She reports persistent posterior headache and neck pain today along with a pressure sensation beneath and behind her eyes.  She denies dizziness, focal weakness, hearing loss, confusion, n/v, vision changes today.  She took 2 advil this am which improved her headache temporarily.     The history is provided by the patient.    Past Medical History:  Diagnosis Date   Dry eye syndrome 07-30-11   tx. eye drops-Systane and Restasis   Fibroids, submucosal 01/13/2017   Gallstones    Mental disorder    anxiety/depression   PMB (postmenopausal bleeding) 01/13/2017    Patient Active Problem List   Diagnosis Date Noted   Special screening for malignant neoplasms, colon 04/06/2018   PMB (postmenopausal bleeding) 01/13/2017   Fibroids, submucosal 01/13/2017   Well woman exam with routine gynecological exam 06/29/2016   Anxiety and depression 05/13/2014   Gallstones 06/28/2011    Past Surgical History:  Procedure Laterality Date   CHOLECYSTECTOMY  08/11/2011   Procedure: LAPAROSCOPIC CHOLECYSTECTOMY WITH INTRAOPERATIVE CHOLANGIOGRAM;  Surgeon: Adin Hector, MD;  Location: [facility] ORS;  Service: General;  Laterality: N/A;  LAPAROSCOPIC CHOLECYSTECTOMY WITH INTRAOPERATIVE CHOLANGIOGRAM    COLONOSCOPY N/A 07/27/2018   Procedure: COLONOSCOPY;  Surgeon: Rogene Houston, MD;  Location: [facility] SUITE;  Service: Endoscopy;   Laterality: N/A;  [place] EXTRACTION  07-30-11   in dentist office     OB History    Gravida  0   Para  0   Term  0   Preterm  0   AB  0   Living  0     SAB  0   TAB  0   Ectopic  0   Multiple  0   Live Births               Home Medications    Prior to Admission medications   Medication Sig Start Date End Date Taking? Authorizing Provider  Cholecalciferol (VITAMIN D-3 PO) Take 5,000 Units by mouth.    [provider]  fish oil-omega-3 fatty acids 1000 MG capsule Take 2 g by mouth daily.     [provider]  FLUoxetine (PROZAC) 20 MG capsule TAKE ONE CAPSULE BY MOUTH ONCE DAILY. 09/04/18   Estill Dooms, NP  ibuprofen (ADVIL,MOTRIN) 200 MG tablet Take 200 mg by mouth every 6 (six) hours as needed for moderate pain.     [provider]  loratadine (CLARITIN) 10 MG tablet Take 10 mg by mouth daily as needed for allergies.    [provider]  vitamin C (ASCORBIC ACID) 500 MG tablet Take 500 mg by mouth daily.    [provider]    Family History Family History  Problem Relation Age of Onset   Heart disease Father    Heart  disease Maternal Grandfather     Social History Social History   Tobacco Use   Smoking status: Former Smoker    Types: Cigarettes    Quit date: 07/29/2001    Years since quitting: 17.5   Smokeless tobacco: Never Used   Tobacco comment: smoked in college  Substance Use Topics   Alcohol use: Yes    Alcohol/week: 1.0 standard drinks    Types: 1 Glasses of wine per week    Comment: 1 glass wine every other day   Drug use: No     Allergies   Patient has no known allergies.   Review of Systems Review of Systems  Constitutional: Negative for chills and fever.  HENT: Negative for hearing loss and voice change.   Eyes: Negative.  Negative for photophobia and visual disturbance.  Respiratory: Negative for chest tightness and  shortness of breath.   Cardiovascular: Negative for chest pain.  Gastrointestinal: Negative.  Negative for nausea and vomiting.  Genitourinary: Negative.   Musculoskeletal: Positive for neck pain. Negative for arthralgias, joint swelling and neck stiffness.  Skin: Negative.  Negative for rash and wound.  Neurological: Positive for syncope and headaches. Negative for dizziness, weakness, light-headedness and numbness.  Psychiatric/Behavioral: Negative.      Physical Exam Updated Vital Signs BP (!) 143/90 (BP Location: Right Arm)    Pulse 62    Temp 98.2 F (36.8 C) (Oral)    Resp 16    Ht 5\' 2"  (1.575 m)    Wt 65.8 kg    SpO2 100%    BMI 26.52 kg/m   Physical Exam Vitals signs and nursing note reviewed.  Constitutional:      Appearance: She is well-developed.  HENT:     Head: Normocephalic and atraumatic. No raccoon eyes or Battle's sign.     Comments: ttp posterior occipital scalp without hematoma or abrasion. Eyes:     General: Lids are normal. Vision grossly intact. Gaze aligned appropriately.     Extraocular Movements: Extraocular movements intact.     Conjunctiva/sclera: Conjunctivae normal.     Pupils: Pupils are equal, round, and reactive to light.  Neck:     Musculoskeletal: Normal range of motion and neck supple. Pain with movement, spinous process tenderness and muscular tenderness present.     Comments: ttp right paracervical and midline c spine tenderness. No edema, no palpable deformity. Cardiovascular:     Rate and Rhythm: Normal rate.     Heart sounds: Normal heart sounds.  Pulmonary:     Effort: Pulmonary effort is normal.  Abdominal:     Palpations: Abdomen is soft.     Tenderness: There is no abdominal tenderness.  Musculoskeletal: Normal range of motion.  Lymphadenopathy:     Cervical: No cervical adenopathy.  Skin:    General: Skin is warm and dry.     Findings: No rash.  Neurological:     Mental Status: She is alert and oriented to person, place, and  time.     GCS: GCS eye subscore is 4. GCS verbal subscore is 5. GCS motor subscore is 6.     Cranial Nerves: No cranial nerve deficit.     Sensory: No sensory deficit.     Gait: Gait normal.     Comments: Normal heel-shin, normal rapid alternating movements. Cranial nerves III-XII intact.  No pronator drift.  Equal grip strength  Psychiatric:        Speech: Speech normal.        Behavior: Behavior  normal.        Thought Content: Thought content normal.      ED Treatments / Results  Labs (all labs ordered are listed, but only abnormal results are displayed) Labs Reviewed - No data to display  EKG None  Radiology Ct Head Wo Contrast  Result Date: 02/03/2019 CLINICAL DATA:  [place] [address] and hit back of head last night, slurred speech, now with headache. EXAM: CT HEAD WITHOUT CONTRAST CT CERVICAL SPINE WITHOUT CONTRAST TECHNIQUE: Multidetector CT imaging of the head and cervical spine was performed following the standard protocol without intravenous contrast. Multiplanar CT image reconstructions of the cervical spine were also generated. COMPARISON:  None. FINDINGS: CT HEAD FINDINGS Brain: Ventricles are normal in size and configuration. There is no mass, hemorrhage, edema or other evidence of acute parenchymal abnormality. No extra-axial hemorrhage. Vascular: No hyperdense vessel or unexpected calcification. Skull: Normal. Negative for fracture or focal lesion. Sinuses/Orbits: No acute finding. Other: None. CT CERVICAL SPINE FINDINGS Alignment: Normal. Skull base and vertebrae: No fracture line or displaced fracture fragment seen. Facet joints are normally aligned throughout. Soft tissues and spinal canal: No prevertebral fluid or swelling. No visible canal hematoma. Disc levels: Disc spaces are well preserved throughout. No central canal stenosis at any level. Upper chest: Negative. Other: None. IMPRESSION: 1. Negative head CT. No intracranial mass, hemorrhage or edema. No skull fracture. 2. No  fracture or acute subluxation within the cervical spine. Electronically Signed   By: Franki Cabot M.D.   On: 02/03/2019 16:20   Ct Cervical Spine Wo Contrast  Result Date: 02/03/2019 CLINICAL DATA:  [place] [address] and hit back of head last night, slurred speech, now with headache. EXAM: CT HEAD WITHOUT CONTRAST CT CERVICAL SPINE WITHOUT CONTRAST TECHNIQUE: Multidetector CT imaging of the head and cervical spine was performed following the standard protocol without intravenous contrast. Multiplanar CT image reconstructions of the cervical spine were also generated. COMPARISON:  None. FINDINGS: CT HEAD FINDINGS Brain: Ventricles are normal in size and configuration. There is no mass, hemorrhage, edema or other evidence of acute parenchymal abnormality. No extra-axial hemorrhage. Vascular: No hyperdense vessel or unexpected calcification. Skull: Normal. Negative for fracture or focal lesion. Sinuses/Orbits: No acute finding. Other: None. CT CERVICAL SPINE FINDINGS Alignment: Normal. Skull base and vertebrae: No fracture line or displaced fracture fragment seen. Facet joints are normally aligned throughout. Soft tissues and spinal canal: No prevertebral fluid or swelling. No visible canal hematoma. Disc levels: Disc spaces are well preserved throughout. No central canal stenosis at any level. Upper chest: Negative. Other: None. IMPRESSION: 1. Negative head CT. No intracranial mass, hemorrhage or edema. No skull fracture. 2. No fracture or acute subluxation within the cervical spine. Electronically Signed   By: Franki Cabot M.D.   On: 02/03/2019 16:20    Procedures Procedures (including critical care time)  Medications Ordered in ED Medications - No data to display   Initial Impression / Assessment and Plan / ED Course  I have reviewed the triage vital signs and the nursing notes.  Pertinent labs & imaging results that were available during my care of the patient were reviewed by me and considered in my medical  decision making (see chart for details).        Ct imaging obtained to rule out epidural/subdural injury, c spine injury given midline pain.  No neuro deficits currently on exam. Imaging negative for acute injury, hx and current sx given LOC suggestive of concussion.  Home instructions/restrictions discussed.  advil or tylenol recommended  for headache sx.  Plan recheck by pcp for a recheck if sx persist beyond the next week.    Final Clinical Impressions(s) / ED Diagnoses   Final diagnoses:  Injury of head, initial encounter  Cervical strain, acute, initial encounter    ED Discharge Orders    None       Landis Martins 02/03/19 1626    Milton Ferguson, MD 02/06/19 1311

## 2019-02-03 NOTE — ED Notes (Signed)
Patient notes a headache and an odd feeling behind right eye.

## 2019-02-05 DIAGNOSIS — K760 Fatty (change of) liver, not elsewhere classified: Secondary | ICD-10-CM | POA: Diagnosis not present

## 2019-02-05 DIAGNOSIS — E663 Overweight: Secondary | ICD-10-CM | POA: Diagnosis not present

## 2019-02-05 DIAGNOSIS — Z0001 Encounter for general adult medical examination with abnormal findings: Secondary | ICD-10-CM | POA: Diagnosis not present

## 2019-02-05 DIAGNOSIS — Z6827 Body mass index (BMI) 27.0-27.9, adult: Secondary | ICD-10-CM | POA: Diagnosis not present

## 2019-02-27 DIAGNOSIS — Z1231 Encounter for screening mammogram for malignant neoplasm of breast: Secondary | ICD-10-CM | POA: Diagnosis not present

## 2019-05-20 DIAGNOSIS — Z20828 Contact with and (suspected) exposure to other viral communicable diseases: Secondary | ICD-10-CM | POA: Diagnosis not present

## 2019-06-26 DIAGNOSIS — Z23 Encounter for immunization: Secondary | ICD-10-CM | POA: Diagnosis not present

## 2019-08-04 DIAGNOSIS — Z23 Encounter for immunization: Secondary | ICD-10-CM | POA: Diagnosis not present

## 2019-09-17 ENCOUNTER — Other Ambulatory Visit: Payer: Self-pay | Admitting: Adult Health

## 2019-11-15 ENCOUNTER — Other Ambulatory Visit: Payer: Self-pay | Admitting: Adult Health

## 2020-02-13 DIAGNOSIS — R1011 Right upper quadrant pain: Secondary | ICD-10-CM | POA: Diagnosis not present

## 2020-02-13 DIAGNOSIS — E663 Overweight: Secondary | ICD-10-CM | POA: Diagnosis not present

## 2020-02-13 DIAGNOSIS — R1010 Upper abdominal pain, unspecified: Secondary | ICD-10-CM | POA: Diagnosis not present

## 2020-02-13 DIAGNOSIS — E669 Obesity, unspecified: Secondary | ICD-10-CM | POA: Diagnosis not present

## 2020-02-13 DIAGNOSIS — Z6827 Body mass index (BMI) 27.0-27.9, adult: Secondary | ICD-10-CM | POA: Diagnosis not present

## 2020-02-19 DIAGNOSIS — Z0001 Encounter for general adult medical examination with abnormal findings: Secondary | ICD-10-CM | POA: Diagnosis not present

## 2020-03-04 ENCOUNTER — Encounter: Payer: Self-pay | Admitting: Adult Health

## 2020-03-04 DIAGNOSIS — Z1231 Encounter for screening mammogram for malignant neoplasm of breast: Secondary | ICD-10-CM | POA: Diagnosis not present

## 2020-03-18 ENCOUNTER — Other Ambulatory Visit: Payer: Self-pay | Admitting: Adult Health

## 2020-03-19 ENCOUNTER — Telehealth: Payer: Self-pay | Admitting: Women's Health

## 2020-03-19 ENCOUNTER — Other Ambulatory Visit: Payer: Self-pay | Admitting: Women's Health

## 2020-03-19 MED ORDER — FLUOXETINE HCL 20 MG PO CAPS: 20.0000 mg | ORAL_CAPSULE | Freq: Every day | ORAL | 0 refills | Status: DC

## 2020-03-19 NOTE — Telephone Encounter (Signed)
Pt states her pharmacy advised her to make an apt for a med refill but is still requesting for medicine to be sent to pharmacy until her next apt 03/31/20 states she is almost out and cannot stop taking the medication. Name of medicine is prozac.

## 2020-03-19 NOTE — Telephone Encounter (Signed)
Telephoned patient at home number and left message that prescription is at the pharmacy.

## 2020-03-31 ENCOUNTER — Ambulatory Visit (INDEPENDENT_AMBULATORY_CARE_PROVIDER_SITE_OTHER): Payer: BC Managed Care – PPO | Admitting: Adult Health

## 2020-03-31 ENCOUNTER — Encounter: Payer: Self-pay | Admitting: Adult Health

## 2020-03-31 VITALS — BP 129/86 | HR 61 | Ht 62.0 in | Wt 163.0 lb

## 2020-03-31 DIAGNOSIS — F32A Depression, unspecified: Secondary | ICD-10-CM

## 2020-03-31 DIAGNOSIS — F419 Anxiety disorder, unspecified: Secondary | ICD-10-CM | POA: Diagnosis not present

## 2020-03-31 MED ORDER — FLUOXETINE HCL 20 MG PO CAPS: 20.0000 mg | ORAL_CAPSULE | Freq: Every day | ORAL | 12 refills | Status: DC

## 2020-03-31 NOTE — Progress Notes (Signed)
  Subjective:     Patient ID: Samantha Vincent, female   DOB: [birth year], 54 y.o.   MRN: [id number]  HPI Samantha Vincent is a 54 year old white female,married, PM in to get refills on Prozac, PCP is Dr Nevada Crane.  Review of Systems Not depressed but down some days, takes prozac every other day now Reviewed past medical,surgical, social and family history. Reviewed medications and allergies.     Objective:   Physical Exam BP 129/86 (BP Location: Left Arm, Patient Position: Sitting, Cuff Size: Normal)   Pulse 61   Ht 5\' 2"  (1.575 m)   Wt 163 lb (73.9 kg)   LMP 05/13/2015   BMI 29.81 kg/m  Skin warm and dry.  Lungs: clear to ausculation bilaterally. Cardiovascular: regular rate and rhythm.   Fall risk is low PHQ 9 score is 1  [facility] - 03/31/20 1637      Pregnancy Intention Screening   Does the patient want to become pregnant in the next year? N/A    Does the patient's partner want to become pregnant in the next year? N/A    Would the patient like to discuss contraceptive options today? No      Contraception Wrap Up   Current Method --   pM   End Method --   PM   Contraception Counseling Provided No          Assessment:     1. Anxiety and depression Refilled prozac Meds ordered this encounter  Medications  . FLUoxetine (PROZAC) 20 MG capsule    Sig: Take 1 capsule (20 mg total) by mouth daily.    Dispense:  30 capsule    Refill:  12    Order Specific Question:   Supervising Provider    Answer:   Florian Buff [[id number]]      Plan:     Return in 4 weeks for pap

## 2020-04-28 ENCOUNTER — Other Ambulatory Visit: Payer: Self-pay | Admitting: Adult Health

## 2020-05-07 ENCOUNTER — Encounter: Payer: Self-pay | Admitting: Adult Health

## 2020-05-07 ENCOUNTER — Other Ambulatory Visit (HOSPITAL_COMMUNITY)
Admission: RE | Admit: 2020-05-07 | Discharge: 2020-05-07 | Disposition: A | Discharge status: Home / Self Care | Payer: BC Managed Care – PPO | Source: Ambulatory Visit | Attending: Adult Health | Admitting: Adult Health

## 2020-05-07 ENCOUNTER — Other Ambulatory Visit: Payer: Self-pay

## 2020-05-07 ENCOUNTER — Ambulatory Visit (INDEPENDENT_AMBULATORY_CARE_PROVIDER_SITE_OTHER): Payer: BC Managed Care – PPO | Admitting: Adult Health

## 2020-05-07 VITALS — BP 122/75 | HR 66 | Ht 62.0 in | Wt 163.0 lb

## 2020-05-07 DIAGNOSIS — F32A Depression, unspecified: Secondary | ICD-10-CM

## 2020-05-07 DIAGNOSIS — Z1211 Encounter for screening for malignant neoplasm of colon: Secondary | ICD-10-CM | POA: Diagnosis not present

## 2020-05-07 DIAGNOSIS — F419 Anxiety disorder, unspecified: Secondary | ICD-10-CM

## 2020-05-07 DIAGNOSIS — Z124 Encounter for screening for malignant neoplasm of cervix: Secondary | ICD-10-CM | POA: Diagnosis not present

## 2020-05-07 DIAGNOSIS — Z01419 Encounter for gynecological examination (general) (routine) without abnormal findings: Secondary | ICD-10-CM

## 2020-05-07 LAB — HEMOCCULT GUIAC POC 1CARD (OFFICE): Fecal Occult Blood, POC: NEGATIVE

## 2020-05-07 NOTE — Progress Notes (Signed)
Patient ID: Samantha Vincent, female   DOB: August 16, 1965, 54 y.o.   MRN: [id number] History of Present Illness: Samantha Vincent is a 54 year old white female,married, PM in for a well woman gyn exam and pap. PCP is Dr Nevada Crane   Current Medications, Allergies, Past Medical History, Past Surgical History, Family History and Social History were reviewed in [place] record.     Review of Systems:  Patient denies any headaches, hearing loss, fatigue, blurred vision, shortness of breath, chest pain, abdominal pain, problems with bowel movements, urination, or intercourse. No joint pain or mood swings. Denies any vaginal bleeding.  Physical Exam:BP 122/75 (BP Location: Right Arm, Patient Position: Sitting, Cuff Size: Normal)   Pulse 66   Ht 5\' 2"  (1.575 m)   Wt 163 lb (73.9 kg)   LMP 05/13/2015   BMI 29.81 kg/m  General:  Well developed, well nourished, no acute distress Skin:  Warm and dry Neck:  Midline trachea, normal thyroid, good ROM, no lymphadenopathy Lungs; Clear to auscultation bilaterally Breast:  No dominant palpable mass, retraction, or nipple discharge Cardiovascular: Regular rate and rhythm Abdomen:  Soft, non tender, no hepatosplenomegaly Pelvic:  External genitalia is normal in appearance, no lesions.  The vagina is pale with loss of moisture and rugae. Urethra has no lesions or masses. The cervix is smooth,pap with high risk HPV genotyping performed.  Uterus is felt to be normal size, shape, and contour.  No adnexal masses or tenderness noted.Bladder is non tender, no masses felt. Rectal: Good sphincter tone, no polyps, + hemorrhoids felt.  Hemoccult negative. Extremities/musculoskeletal:  No swelling or varicosities noted, no clubbing or cyanosis Psych:  No mood changes, alert and cooperative,seems happy AA is 3 Fall risk is low PHQ 9 score is 2, she is on Prozac. Her daddy died last week  [facility] - May 21, 2020 1010      Pregnancy Intention Screening   Does the  patient want to become pregnant in the next year? N/A    Does the patient's partner want to become pregnant in the next year? N/A    Would the patient like to discuss contraceptive options today? No      Contraception Wrap Up   Current Method No Method - Other Reason   Postmenopausal   End Method No Method - Other Reason   PM   Contraception Counseling Provided No         Examination chaperoned by Johnsie Cancel.  Impression and Plan: 1. Routine cervical smear Pap sent   2. Encounter for gynecological examination with Papanicolaou smear of cervix Pap sent Physical in 1 year Pap in 3 if normal Mammogram yearly Colonoscopy per [facility] with PCP Pt has gotten COVID vaccines   3. Encounter for screening fecal occult blood testing   4. Anxiety and depression Continue prozac has refills

## 2020-05-09 LAB — CYTOLOGY - PAP
Comment: NEGATIVE
Diagnosis: NEGATIVE
High risk HPV: NEGATIVE

## 2020-08-08 DIAGNOSIS — E782 Mixed hyperlipidemia: Secondary | ICD-10-CM | POA: Diagnosis not present

## 2020-08-08 DIAGNOSIS — K760 Fatty (change of) liver, not elsewhere classified: Secondary | ICD-10-CM | POA: Diagnosis not present

## 2020-08-08 DIAGNOSIS — E663 Overweight: Secondary | ICD-10-CM | POA: Diagnosis not present

## 2020-08-08 DIAGNOSIS — F411 Generalized anxiety disorder: Secondary | ICD-10-CM | POA: Diagnosis not present

## 2020-08-21 DIAGNOSIS — Z136 Encounter for screening for cardiovascular disorders: Secondary | ICD-10-CM | POA: Diagnosis not present

## 2020-08-21 DIAGNOSIS — Z6829 Body mass index (BMI) 29.0-29.9, adult: Secondary | ICD-10-CM | POA: Diagnosis not present

## 2020-08-21 DIAGNOSIS — F411 Generalized anxiety disorder: Secondary | ICD-10-CM | POA: Diagnosis not present

## 2020-08-21 DIAGNOSIS — E663 Overweight: Secondary | ICD-10-CM | POA: Diagnosis not present

## 2021-01-12 DIAGNOSIS — H04123 Dry eye syndrome of bilateral lacrimal glands: Secondary | ICD-10-CM | POA: Diagnosis not present

## 2021-02-25 DIAGNOSIS — Z0000 Encounter for general adult medical examination without abnormal findings: Secondary | ICD-10-CM | POA: Diagnosis not present

## 2021-03-10 DIAGNOSIS — Z1231 Encounter for screening mammogram for malignant neoplasm of breast: Secondary | ICD-10-CM | POA: Diagnosis not present

## 2021-03-25 DIAGNOSIS — Z0001 Encounter for general adult medical examination with abnormal findings: Secondary | ICD-10-CM | POA: Diagnosis not present

## 2021-04-20 ENCOUNTER — Other Ambulatory Visit: Payer: Self-pay | Admitting: Adult Health

## 2021-06-18 ENCOUNTER — Other Ambulatory Visit: Payer: Self-pay | Admitting: Adult Health

## 2021-07-03 IMAGING — CT CT CERVICAL SPINE WITHOUT CONTRAST
4 of 7 series · 14 of 33 positions shown, 15 images · non-contrast
Comparison: None.

CLINICAL DATA: Fell and hit back of head last night, slurred
speech, now with headache.

EXAM:
CT HEAD WITHOUT CONTRAST
CT CERVICAL SPINE WITHOUT CONTRAST
TECHNIQUE: Multidetector CT imaging of the head and cervical spine was
performed following the standard protocol without intravenous
contrast. Multiplanar CT image reconstructions of the cervical spine
were also generated.

[Series 8: c spine soft · axial · 0.24mm/px · z∈[+1236,+1332]mm · 4 of 81 slices shown]
[im 17/81  soft-tissue]
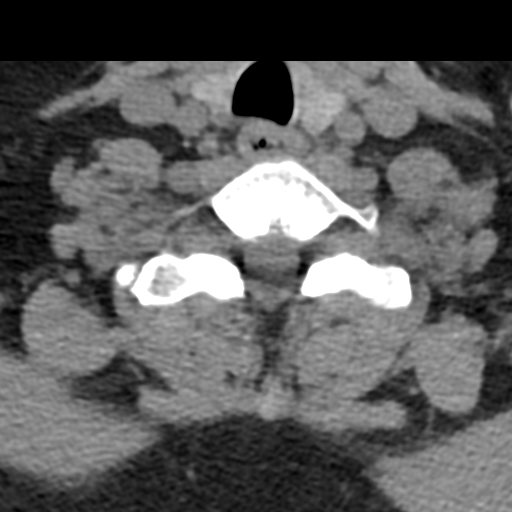
[im 33/81  soft-tissue]
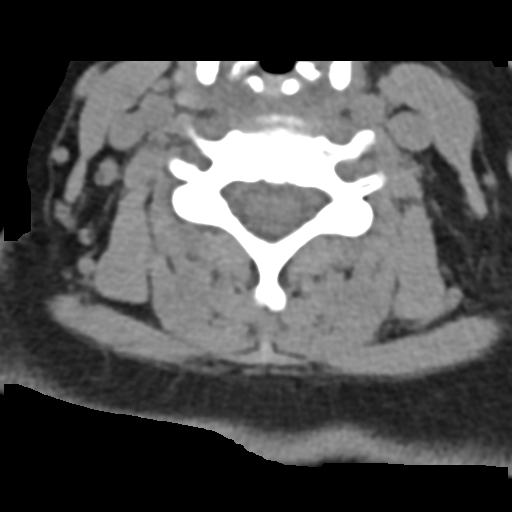
[im 49/81  soft-tissue]
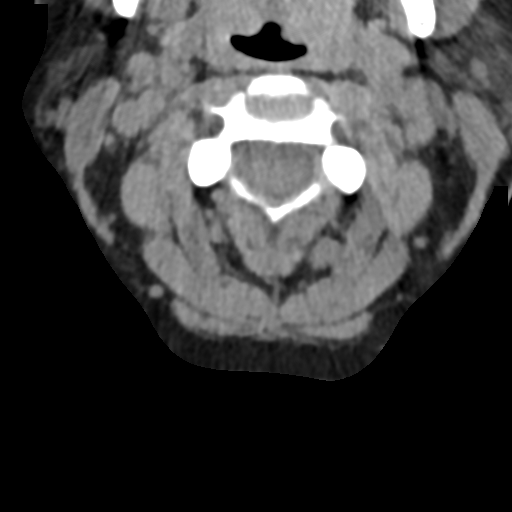
[im 65/81  soft-tissue]
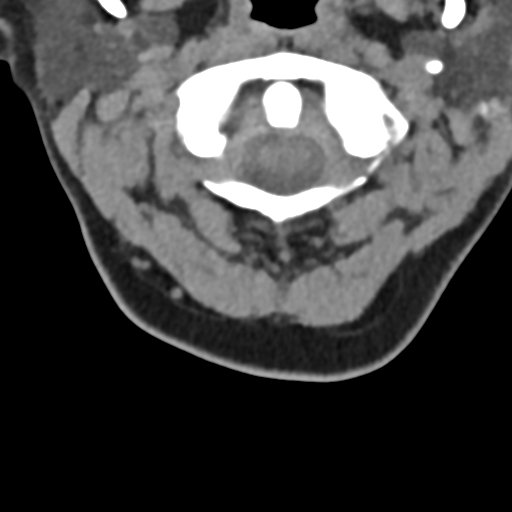

[Series 9: sagittal bone · sagittal · 0.23mm/px · 5 of 61 slices shown]
[im 11/61  bone]
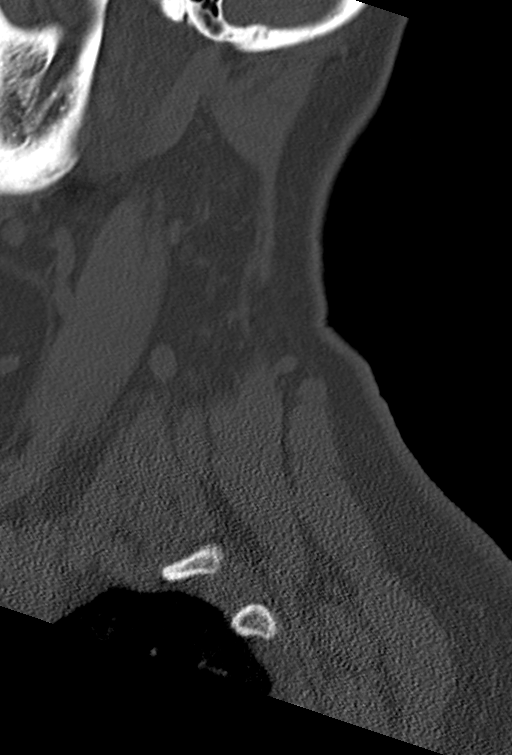
[im 21/61  bone]
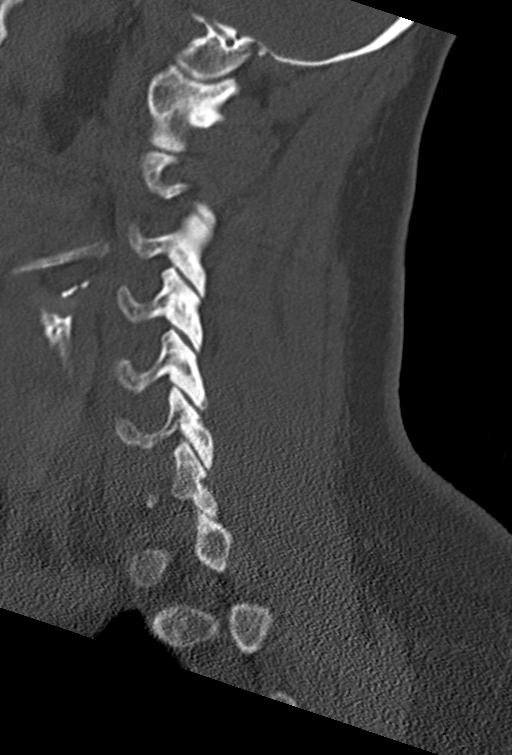
[im 31/61  bone]
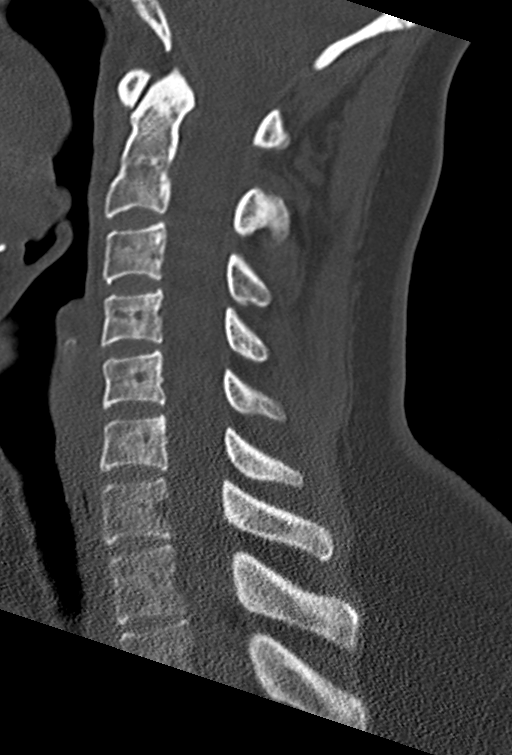
[im 41/61  bone]
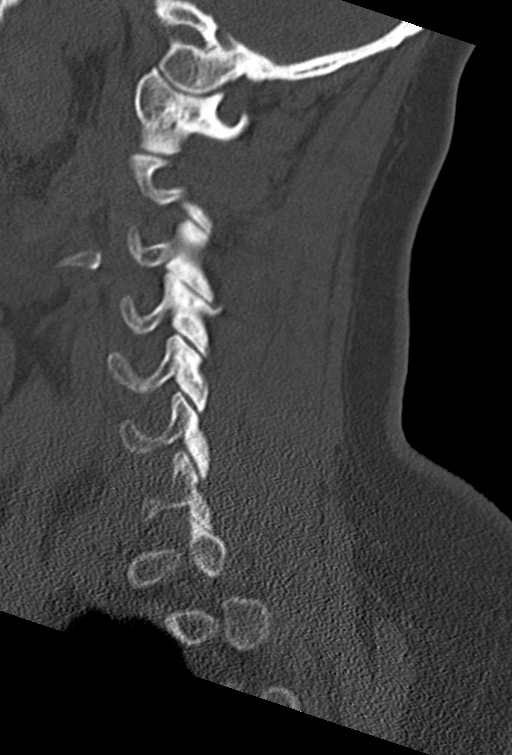
[im 51/61  bone]
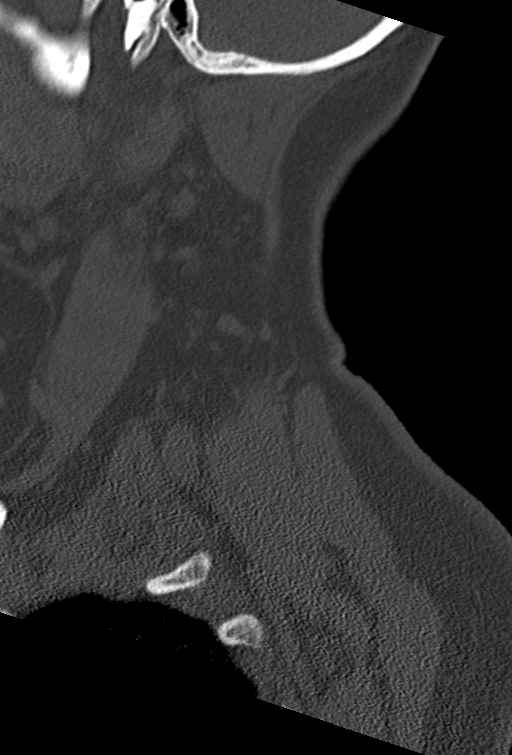

[Series 10: coronal bone · coronal · 0.23mm/px · 1 of 61 slices shown]
[im 31/61  bone]
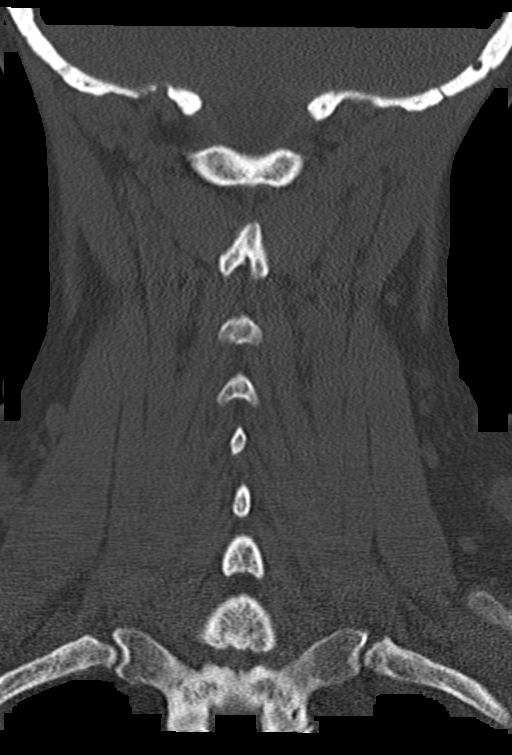

[Series 11: orthogonal axials · axial · 0.21mm/px · z∈[+1220,+1307]mm · 4 of 82 slices shown, 5 images]
[im 17/82  soft-tissue]
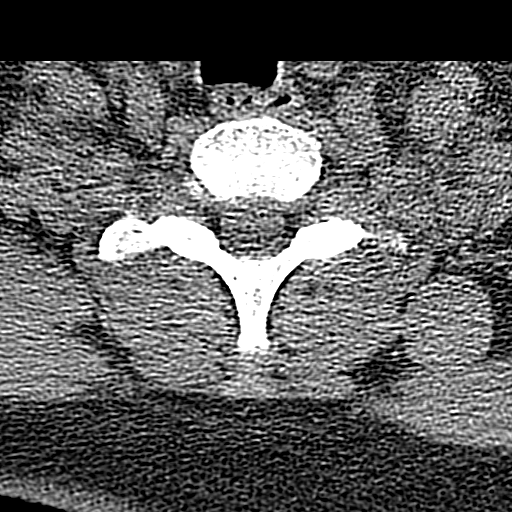
[im 17/82  bone]
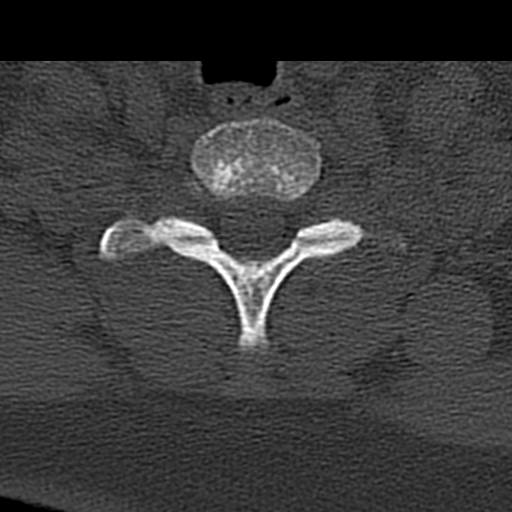
[im 33/82  bone]
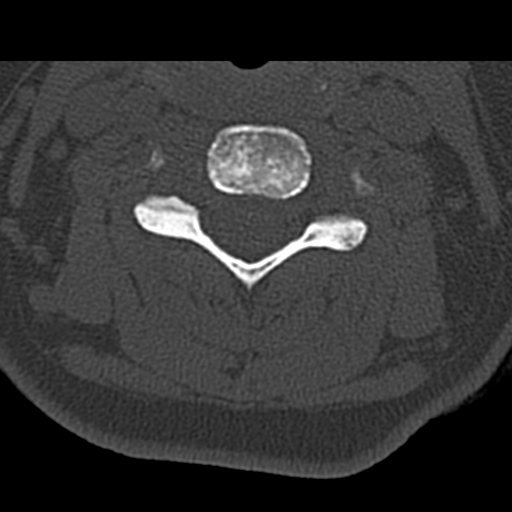
[im 49/82  bone]
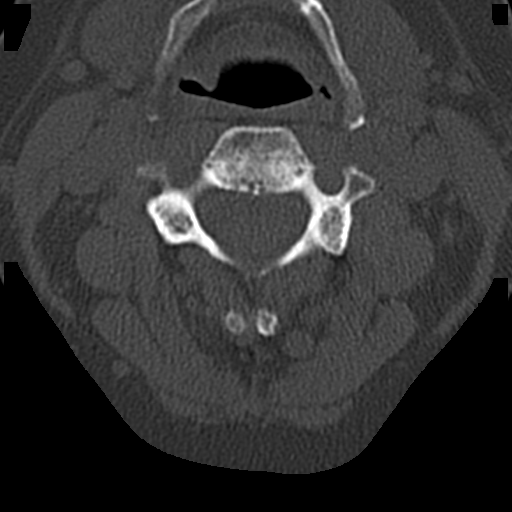
[im 65/82  bone]
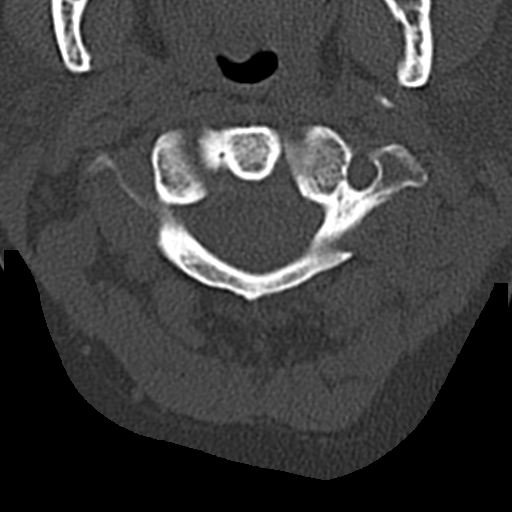

[14 of 33 positions shown; findings below may reference images not displayed]

FINDINGS: CT HEAD FINDINGS

Brain: Ventricles are normal in size and configuration. There is no
mass, hemorrhage, edema or other evidence of acute parenchymal
abnormality. No extra-axial hemorrhage.

Vascular: No hyperdense vessel or unexpected calcification.

Skull: Normal. Negative for fracture or focal lesion.

Sinuses/Orbits: No acute finding.

Other: None.

CT CERVICAL SPINE FINDINGS

Alignment: Normal.

Skull base and vertebrae: No fracture line or displaced fracture
fragment seen. Facet joints are normally aligned throughout.

Soft tissues and spinal canal: No prevertebral fluid or swelling. No
visible canal hematoma.

Disc levels: Disc spaces are well preserved throughout. No central
canal stenosis at any level.

Upper chest: Negative.

Other: None.
IMPRESSION: 1. Negative head CT. No intracranial mass, hemorrhage or edema. No
skull fracture.
2. No fracture or acute subluxation within the cervical spine.

## 2021-10-14 DIAGNOSIS — Z1283 Encounter for screening for malignant neoplasm of skin: Secondary | ICD-10-CM | POA: Diagnosis not present

## 2021-10-14 DIAGNOSIS — D225 Melanocytic nevi of trunk: Secondary | ICD-10-CM | POA: Diagnosis not present

## 2021-11-23 DIAGNOSIS — E669 Obesity, unspecified: Secondary | ICD-10-CM | POA: Diagnosis not present

## 2021-11-23 DIAGNOSIS — E782 Mixed hyperlipidemia: Secondary | ICD-10-CM | POA: Diagnosis not present

## 2021-11-25 DIAGNOSIS — F411 Generalized anxiety disorder: Secondary | ICD-10-CM | POA: Diagnosis not present

## 2021-11-25 DIAGNOSIS — E782 Mixed hyperlipidemia: Secondary | ICD-10-CM | POA: Diagnosis not present

## 2021-11-25 DIAGNOSIS — R030 Elevated blood-pressure reading, without diagnosis of hypertension: Secondary | ICD-10-CM | POA: Diagnosis not present

## 2021-11-25 DIAGNOSIS — K760 Fatty (change of) liver, not elsewhere classified: Secondary | ICD-10-CM | POA: Diagnosis not present

## 2022-03-16 DIAGNOSIS — Z1231 Encounter for screening mammogram for malignant neoplasm of breast: Secondary | ICD-10-CM | POA: Diagnosis not present

## 2022-03-22 DIAGNOSIS — Z0000 Encounter for general adult medical examination without abnormal findings: Secondary | ICD-10-CM | POA: Diagnosis not present

## 2022-03-29 DIAGNOSIS — K760 Fatty (change of) liver, not elsewhere classified: Secondary | ICD-10-CM | POA: Diagnosis not present

## 2022-03-29 DIAGNOSIS — Z0000 Encounter for general adult medical examination without abnormal findings: Secondary | ICD-10-CM | POA: Diagnosis not present

## 2022-03-29 DIAGNOSIS — R030 Elevated blood-pressure reading, without diagnosis of hypertension: Secondary | ICD-10-CM | POA: Diagnosis not present

## 2022-03-29 DIAGNOSIS — E782 Mixed hyperlipidemia: Secondary | ICD-10-CM | POA: Diagnosis not present

## 2022-06-28 DIAGNOSIS — M2550 Pain in unspecified joint: Secondary | ICD-10-CM | POA: Diagnosis not present

## 2022-06-28 DIAGNOSIS — Z20822 Contact with and (suspected) exposure to covid-19: Secondary | ICD-10-CM | POA: Diagnosis not present

## 2022-06-28 DIAGNOSIS — H9312 Tinnitus, left ear: Secondary | ICD-10-CM | POA: Diagnosis not present

## 2022-06-28 DIAGNOSIS — I10 Essential (primary) hypertension: Secondary | ICD-10-CM | POA: Diagnosis not present

## 2022-06-28 DIAGNOSIS — R110 Nausea: Secondary | ICD-10-CM | POA: Diagnosis not present

## 2022-06-28 DIAGNOSIS — R519 Headache, unspecified: Secondary | ICD-10-CM | POA: Diagnosis not present

## 2023-03-22 DIAGNOSIS — Z1231 Encounter for screening mammogram for malignant neoplasm of breast: Secondary | ICD-10-CM | POA: Diagnosis not present

## 2023-04-01 ENCOUNTER — Telehealth: Payer: Self-pay | Admitting: Adult Health

## 2023-04-01 NOTE — Telephone Encounter (Signed)
Pt aware that mammogram was negative, repeat in 1 year

## 2023-06-20 ENCOUNTER — Ambulatory Visit: Payer: BC Managed Care – PPO | Admitting: Internal Medicine

## 2023-06-20 ENCOUNTER — Encounter: Payer: Self-pay | Admitting: Internal Medicine

## 2023-06-20 VITALS — BP 124/78 | HR 68 | Ht 62.0 in | Wt 164.2 lb

## 2023-06-20 DIAGNOSIS — F419 Anxiety disorder, unspecified: Secondary | ICD-10-CM | POA: Diagnosis not present

## 2023-06-20 DIAGNOSIS — E66811 Obesity, class 1: Secondary | ICD-10-CM | POA: Insufficient documentation

## 2023-06-20 DIAGNOSIS — I10 Essential (primary) hypertension: Secondary | ICD-10-CM | POA: Insufficient documentation

## 2023-06-20 DIAGNOSIS — Z1159 Encounter for screening for other viral diseases: Secondary | ICD-10-CM

## 2023-06-20 DIAGNOSIS — Z114 Encounter for screening for human immunodeficiency virus [HIV]: Secondary | ICD-10-CM

## 2023-06-20 DIAGNOSIS — F32A Depression, unspecified: Secondary | ICD-10-CM

## 2023-06-20 NOTE — Assessment & Plan Note (Signed)
Currently prescribed fluoxetine 20 mg daily.  Mood is stable.  She is not interested in making any medication adjustments at this time.

## 2023-06-20 NOTE — Progress Notes (Signed)
New Patient Office Visit  Subjective    Patient ID: Samantha Vincent, female    DOB: December 13, 1965  Age: 57 y.o. MRN: [id number]  CC:  Chief Complaint  Patient presents with   Establish Care    HPI [name] presents to establish care.  She is a 57 year old woman who endorses a past medical history significant for HTN and anxiety/depression.  Previously followed by Dwana Melena, MD. [name] reports feeling well today.  She is asymptomatic and has no acute concerns to discuss aside from desiring to establish care.  She currently works at [employer].  She reports drinking one glass of wine nightly.  Denies tobacco and illicit drug use.  Family medical history is significant for CAD and vascular dementia.  Chronic medical conditions and outstanding preventative care items discussed today are individually addressed in A/P below.   Outpatient Encounter Medications as of 06/20/2023  Medication Sig   Cholecalciferol (VITAMIN D-3 PO) Take 5,000 Units by mouth.   fish oil-omega-3 fatty acids 1000 MG capsule Take 2 g by mouth daily.    FLUoxetine (PROZAC) 20 MG capsule TAKE ONE CAPSULE BY MOUTH ONCE DAILY.   losartan (COZAAR) 25 MG tablet Take 25 mg by mouth daily.   [DISCONTINUED] ibuprofen (ADVIL,MOTRIN) 200 MG tablet Take 200 mg by mouth every 6 (six) hours as needed for moderate pain.  (Patient not taking: Reported on 06/20/2023)   [DISCONTINUED] loratadine (CLARITIN) 10 MG tablet Take 10 mg by mouth daily as needed for allergies. (Patient not taking: Reported on 06/20/2023)   [DISCONTINUED] vitamin C (ASCORBIC ACID) 500 MG tablet Take 500 mg by mouth daily. (Patient not taking: Reported on 06/20/2023)   No facility-administered encounter medications on file as of 06/20/2023.    Past Medical History:  Diagnosis Date   Dry eye syndrome 07-30-11   tx. eye drops-Systane and Restasis   Fibroids, submucosal 01/13/2017   Gallstones    Mental disorder    anxiety/depression   PMB  (postmenopausal bleeding) 01/13/2017    Past Surgical History:  Procedure Laterality Date   CHOLECYSTECTOMY  08/11/2011   Procedure: LAPAROSCOPIC CHOLECYSTECTOMY WITH INTRAOPERATIVE CHOLANGIOGRAM;  Surgeon: Ernestene Mention, MD;  Location: [facility];  Service: General;  Laterality: N/A;  LAPAROSCOPIC CHOLECYSTECTOMY WITH INTRAOPERATIVE CHOLANGIOGRAM    COLONOSCOPY N/A 07/27/2018   Procedure: COLONOSCOPY;  Surgeon: Malissa Hippo, MD;  Location: [facility];  Service: Endoscopy;  Laterality: N/A;  730   TONSILLECTOMY  1971   TONSILLECTOMY     WISDOM TOOTH EXTRACTION  07-30-11   in dentist office    Family History  Problem Relation Age of Onset   Heart disease Father    Heart disease Maternal Grandfather     Social History   Socioeconomic History   Marital status: Married    Spouse name: Not on file   Number of children: 0   Years of education: Not on file   Highest education level: Not on file  Occupational History   Not on file  Tobacco Use   Smoking status: Former    Current packs/day: 0.00    Types: Cigarettes    Quit date: 07/29/2001    Years since quitting: 21.9   Smokeless tobacco: Never   Tobacco comments:    smoked in college  Vaping Use   Vaping status: Never Used  Substance and Sexual Activity   Alcohol use: Yes    Alcohol/week: 1.0 standard drink of alcohol    Types: 1 Glasses  of wine per week    Comment: 1 glass wine every other day   Drug use: No   Sexual activity: Yes    Birth control/protection: None, Post-menopausal  Other Topics Concern   Not on file  Social History Narrative   Not on file   Social Drivers of Health   Financial Resource Strain: Low Risk  (05/07/2020)   Overall Financial Resource Strain (CARDIA)    Difficulty of Paying Living Expenses: Not hard at all  Food Insecurity: No Food Insecurity (05/07/2020)   Hunger Vital Sign    Worried About Running Out of Food in the Last Year: Never true    Ran Out of Food in the Last Year: Never  true  Transportation Needs: No Transportation Needs (05/07/2020)   PRAPARE - Administrator, Civil Service (Medical): No    Lack of Transportation (Non-Medical): No  Physical Activity: Inactive (05/07/2020)   Exercise Vital Sign    Days of Exercise per Week: 0 days    Minutes of Exercise per Session: 0 min  Stress: No Stress Concern Present (05/07/2020)   [employer] of Occupational Health - Occupational Stress Questionnaire    Feeling of Stress : Not at all  Social Connections: Socially Integrated (05/07/2020)   Social Connection and Isolation Panel [NHANES]    Frequency of Communication with Friends and Family: More than three times a week    Frequency of Social Gatherings with Friends and Family: More than three times a week    Attends Religious Services: More than 4 times per year    Active Member of [employer] or Organizations: Yes    Attends Engineer, structural: More than 4 times per year    Marital Status: Married  Catering manager Violence: Not At Risk (05/07/2020)   Humiliation, Afraid, Rape, and Kick questionnaire    Fear of Current or Ex-Partner: No    Emotionally Abused: No    Physically Abused: No    Sexually Abused: No   Review of Systems  Constitutional:  Negative for chills and fever.  HENT:  Negative for sore throat.   Respiratory:  Negative for cough and shortness of breath.   Cardiovascular:  Negative for chest pain, palpitations and leg swelling.  Gastrointestinal:  Negative for abdominal pain, blood in stool, constipation, diarrhea, nausea and vomiting.  Genitourinary:  Negative for dysuria and hematuria.  Musculoskeletal:  Negative for myalgias.  Skin:  Negative for itching and rash.  Neurological:  Negative for dizziness and headaches.  Psychiatric/Behavioral:  Negative for depression and suicidal ideas.    Objective    BP 124/78 (BP Location: Left Arm, Patient Position: Sitting, Cuff Size: Normal)   Pulse 68   Ht 5\' 2"  (1.575 m)    Wt 164 lb 3.2 oz (74.5 kg)   LMP 05/13/2015   SpO2 98%   BMI 30.03 kg/m   Physical Exam Vitals reviewed.  Constitutional:      General: She is not in acute distress.    Appearance: Normal appearance. She is obese. She is not toxic-appearing.  HENT:     Head: Normocephalic and atraumatic.     Right Ear: External ear normal.     Left Ear: External ear normal.     Nose: Nose normal. No congestion or rhinorrhea.     Mouth/Throat:     Mouth: Mucous membranes are moist.     Pharynx: Oropharynx is clear. No oropharyngeal exudate or posterior oropharyngeal erythema.  Eyes:     General:  No scleral icterus.    Extraocular Movements: Extraocular movements intact.     Conjunctiva/sclera: Conjunctivae normal.     Pupils: Pupils are equal, round, and reactive to light.  Cardiovascular:     Rate and Rhythm: Normal rate and regular rhythm.     Pulses: Normal pulses.     Heart sounds: Normal heart sounds. No murmur heard.    No friction rub. No gallop.  Pulmonary:     Effort: Pulmonary effort is normal.     Breath sounds: Normal breath sounds. No wheezing, rhonchi or rales.  Abdominal:     General: Abdomen is flat. Bowel sounds are normal. There is no distension.     Palpations: Abdomen is soft.     Tenderness: There is no abdominal tenderness.  Musculoskeletal:        General: No swelling. Normal range of motion.     Cervical back: Normal range of motion.     Right lower leg: No edema.     Left lower leg: No edema.  Lymphadenopathy:     Cervical: No cervical adenopathy.  Skin:    General: Skin is warm and dry.     Capillary Refill: Capillary refill takes less than 2 seconds.     Coloration: Skin is not jaundiced.  Neurological:     General: No focal deficit present.     Mental Status: She is alert and oriented to person, place, and time.  Psychiatric:        Mood and Affect: Mood normal.        Behavior: Behavior normal.    Assessment & Plan:   Problem List Items Addressed  This Visit       Essential hypertension - Primary   Currently prescribed losartan 25 mg daily.  BP today is 124/78.  No medication changes are indicated at this time.      Anxiety and depression   Currently prescribed fluoxetine 20 mg daily.  Mood is stable.  She is not interested in making any medication adjustments at this time.      Obesity (BMI 30.0-34.9)   Her weight today is 164 pounds.  BMI 30.  She expresses frustration with recent weight gain and is aware of the need to make significant lifestyle changes in an effort to lose weight.  We discussed a referral to medical nutrition therapy, which she will consider.  Basic labs ordered today.      Return in about 1 year (around 06/19/2024).   Billie Lade, MD

## 2023-06-20 NOTE — Assessment & Plan Note (Signed)
Currently prescribed losartan 25 mg daily.  BP today is 124/78.  No medication changes are indicated at this time.

## 2023-06-20 NOTE — Patient Instructions (Signed)
It was a pleasure to see you today.  Thank you for giving [place] the opportunity to be involved in your care.  Below is a brief recap of your visit and next steps.  We will plan to see you again in 1 year.   Summary You have established care today No medication changes were made Repeat labs ordered Follow up in 1 year

## 2023-06-20 NOTE — Assessment & Plan Note (Signed)
Her weight today is 164 pounds.  BMI 30.  She expresses frustration with recent weight gain and is aware of the need to make significant lifestyle changes in an effort to lose weight.  We discussed a referral to medical nutrition therapy, which she will consider.  Basic labs ordered today.

## 2023-06-22 LAB — CBC WITH DIFFERENTIAL/PLATELET
Basophils Absolute: 0 10*3/uL (ref 0.0–0.2)
Basos: 1 %
EOS (ABSOLUTE): 0.2 10*3/uL (ref 0.0–0.4)
Eos: 5 %
Hematocrit: 43.5 % (ref 34.0–46.6)
Hemoglobin: 14.2 g/dL (ref 11.1–15.9)
Immature Grans (Abs): 0 10*3/uL (ref 0.0–0.1)
Immature Granulocytes: 0 %
Lymphocytes Absolute: 1.5 10*3/uL (ref 0.7–3.1)
Lymphs: 30 %
MCH: 29.2 pg (ref 26.6–33.0)
MCHC: 32.6 g/dL (ref 31.5–35.7)
MCV: 90 fL (ref 79–97)
Monocytes Absolute: 0.5 10*3/uL (ref 0.1–0.9)
Monocytes: 10 %
Neutrophils Absolute: 2.7 10*3/uL (ref 1.4–7.0)
Neutrophils: 54 %
Platelets: 269 10*3/uL (ref 150–450)
RBC: 4.86 x10E6/uL (ref 3.77–5.28)
RDW: 12.3 % (ref 11.7–15.4)
WBC: 5.1 10*3/uL (ref 3.4–10.8)

## 2023-06-22 LAB — CMP14+EGFR
ALT: 15 [IU]/L (ref 0–32)
AST: 18 [IU]/L (ref 0–40)
Albumin: 4.2 g/dL (ref 3.8–4.9)
Alkaline Phosphatase: 102 [IU]/L (ref 44–121)
BUN/Creatinine Ratio: 15 (ref 9–23)
BUN: 12 mg/dL (ref 6–24)
Bilirubin Total: 0.3 mg/dL (ref 0.0–1.2)
CO2: 22 mmol/L (ref 20–29)
Calcium: 9.5 mg/dL (ref 8.7–10.2)
Chloride: 105 mmol/L (ref 96–106)
Creatinine, Ser: 0.78 mg/dL (ref 0.57–1.00)
Globulin, Total: 3 g/dL (ref 1.5–4.5)
Glucose: 102 mg/dL — ABNORMAL HIGH (ref 70–99)
Potassium: 4.4 mmol/L (ref 3.5–5.2)
Sodium: 141 mmol/L (ref 134–144)
Total Protein: 7.2 g/dL (ref 6.0–8.5)
eGFR: 88 mL/min/{1.73_m2} (ref >59)

## 2023-06-22 LAB — HIV ANTIBODY (ROUTINE TESTING W REFLEX): HIV Screen 4th Generation wRfx: NONREACTIVE

## 2023-06-22 LAB — LIPID PANEL
Chol/HDL Ratio: 2.9 {ratio} (ref 0.0–4.4)
Cholesterol, Total: 191 mg/dL (ref 100–199)
HDL: 65 mg/dL (ref >39)
LDL Chol Calc (NIH): 110 mg/dL — ABNORMAL HIGH (ref 0–99)
Triglycerides: 89 mg/dL (ref 0–149)
VLDL Cholesterol Cal: 16 mg/dL (ref 5–40)

## 2023-06-22 LAB — TSH+FREE T4
Free T4: 1.09 ng/dL (ref 0.82–1.77)
TSH: 2.55 u[IU]/mL (ref 0.450–4.500)

## 2023-06-22 LAB — B12 AND FOLATE PANEL
Folate: 7.4 ng/mL (ref >3.0)
Vitamin B-12: 261 pg/mL (ref 232–1245)

## 2023-06-22 LAB — HCV AB W REFLEX TO QUANT PCR: HCV Ab: NONREACTIVE

## 2023-06-22 LAB — HEMOGLOBIN A1C
Est. average glucose Bld gHb Est-mCnc: 111 mg/dL
Hgb A1c MFr Bld: 5.5 % (ref 4.8–5.6)

## 2023-06-22 LAB — VITAMIN D 25 HYDROXY (VIT D DEFICIENCY, FRACTURES): Vit D, 25-Hydroxy: 52.9 ng/mL (ref 30.0–100.0)

## 2023-06-22 LAB — HCV INTERPRETATION

## 2023-10-20 DIAGNOSIS — H04412 Chronic dacryocystitis of left lacrimal passage: Secondary | ICD-10-CM | POA: Diagnosis not present

## 2023-10-20 DIAGNOSIS — H04552 Acquired stenosis of left nasolacrimal duct: Secondary | ICD-10-CM | POA: Diagnosis not present

## 2023-10-20 DIAGNOSIS — H0279 Other degenerative disorders of eyelid and periocular area: Secondary | ICD-10-CM | POA: Diagnosis not present

## 2023-10-20 DIAGNOSIS — H04129 Dry eye syndrome of unspecified lacrimal gland: Secondary | ICD-10-CM | POA: Diagnosis not present

## 2023-10-20 DIAGNOSIS — H04222 Epiphora due to insufficient drainage, left lacrimal gland: Secondary | ICD-10-CM | POA: Diagnosis not present

## 2024-03-08 ENCOUNTER — Telehealth: Admitting: Family Medicine

## 2024-03-08 DIAGNOSIS — R197 Diarrhea, unspecified: Secondary | ICD-10-CM | POA: Diagnosis not present

## 2024-03-08 NOTE — Progress Notes (Signed)
   Virtual Visit via Video Note  I connected with Bard LELON Brain on 03/08/24 at  3:00 PM EDT by a video enabled telemedicine application and verified that I am speaking with the correct person using two identifiers.  Patient Location: Home Provider Location: Office/Clinic  I discussed the limitations, risks, security, and privacy concerns of performing an evaluation and management service by video and the availability of in person appointments. I also discussed with the patient that there may be a patient responsible charge related to this service. The patient expressed understanding and agreed to proceed.  Subjective: PCP: No primary care provider on file.  No chief complaint on file.  The patient has been experiencing recurrent watery diarrhea for the past four weeks, occurring every time she eats, about 2-5 times per day, and sometimes waking her from sleep. She also reports occasional nausea and dizziness, but denies abdominal pain, bloating, fever, or a history of irritable bowel syndrome. Nothing seems to trigger or worsen the symptoms, and she has not tried any treatments so far.   Dizznnes   ROS: Per HPI  Current Outpatient Medications:    Cholecalciferol (VITAMIN D -3 PO), Take 5,000 Units by mouth., Disp: , Rfl:    fish oil-omega-3 fatty acids 1000 MG capsule, Take 2 g by mouth daily. , Disp: , Rfl:    FLUoxetine  (PROZAC ) 20 MG capsule, TAKE ONE CAPSULE BY MOUTH ONCE DAILY., Disp: 30 capsule, Rfl: 12   losartan (COZAAR) 25 MG tablet, Take 25 mg by mouth daily., Disp: , Rfl:   Observations/Objective: There were no vitals filed for this visit. Physical Exam Patient is alert and no acute distress noted.   Assessment and Plan: Diarrhea, unspecified type Assessment & Plan: Labs ordered and GI profile stool Advise OTC loperamide and bismuth subsalicylate for diarrhea management Explained patient increase fluid intake 8 glasses of water  a day to prevent dehydration. Avoid  caffeine and alcohol. Add semisolid and low-fiber foods gradually as bowel movements return to normal. Follow a BRAT diet bananas, rice, applesauce, toast helping firm up stool. Follow-up in unable to keep food/fluid down x 24 hours, dizziness, fevers, worsening or persistent symptoms to present to ED or contact primary care provider. Patient verbalizes understanding regarding plan of care and all questions answered.    Orders: -     GI Profile, Stool, PCR -     CBC with Differential/Platelet -     CMP14+EGFR -     TSH + free T4    Follow Up Instructions: No follow-ups on file.   I discussed the assessment and treatment plan with the patient. The patient was provided an opportunity to ask questions, and all were answered. The patient agreed with the plan and demonstrated an understanding of the instructions.   The patient was advised to call back or seek an in-person evaluation if the symptoms worsen or if the condition fails to improve as anticipated.  The above assessment and management plan was discussed with the patient. The patient verbalized understanding of and has agreed to the management plan.   Hilario Kidd Wilhelmena Falter, FNP

## 2024-03-08 NOTE — Assessment & Plan Note (Signed)
 Labs ordered and GI profile stool Advise OTC loperamide and bismuth subsalicylate for diarrhea management Explained patient increase fluid intake 8 glasses of water  a day to prevent dehydration. Avoid caffeine and alcohol. Add semisolid and low-fiber foods gradually as bowel movements return to normal. Follow a BRAT diet bananas, rice, applesauce, toast helping firm up stool. Follow-up in unable to keep food/fluid down x 24 hours, dizziness, fevers, worsening or persistent symptoms to present to ED or contact primary care provider. Patient verbalizes understanding regarding plan of care and all questions answered.

## 2024-03-09 DIAGNOSIS — R197 Diarrhea, unspecified: Secondary | ICD-10-CM | POA: Diagnosis not present

## 2024-03-10 LAB — CMP14+EGFR
ALT: 15 IU/L (ref 0–32)
AST: 17 IU/L (ref 0–40)
Albumin: 4.3 g/dL (ref 3.8–4.9)
Alkaline Phosphatase: 100 IU/L (ref 49–135)
BUN/Creatinine Ratio: 21 (ref 9–23)
BUN: 18 mg/dL (ref 6–24)
Bilirubin Total: 0.3 mg/dL (ref 0.0–1.2)
CO2: 21 mmol/L (ref 20–29)
Calcium: 9.4 mg/dL (ref 8.7–10.2)
Chloride: 100 mmol/L (ref 96–106)
Creatinine, Ser: 0.84 mg/dL (ref 0.57–1.00)
Globulin, Total: 3 g/dL (ref 1.5–4.5)
Glucose: 89 mg/dL (ref 70–99)
Potassium: 5.2 mmol/L (ref 3.5–5.2)
Sodium: 134 mmol/L (ref 134–144)
Total Protein: 7.3 g/dL (ref 6.0–8.5)
eGFR: 80 mL/min/1.73 (ref >59)

## 2024-03-10 LAB — CBC WITH DIFFERENTIAL/PLATELET
Basophils Absolute: 0 x10E3/uL (ref 0.0–0.2)
Basos: 1 %
EOS (ABSOLUTE): 0.1 x10E3/uL (ref 0.0–0.4)
Eos: 2 %
Hematocrit: 43.5 % (ref 34.0–46.6)
Hemoglobin: 14.1 g/dL (ref 11.1–15.9)
Immature Grans (Abs): 0 x10E3/uL (ref 0.0–0.1)
Immature Granulocytes: 0 %
Lymphocytes Absolute: 1.8 x10E3/uL (ref 0.7–3.1)
Lymphs: 30 %
MCH: 29.3 pg (ref 26.6–33.0)
MCHC: 32.4 g/dL (ref 31.5–35.7)
MCV: 90 fL (ref 79–97)
Monocytes Absolute: 0.8 x10E3/uL (ref 0.1–0.9)
Monocytes: 13 %
Neutrophils Absolute: 3.3 x10E3/uL (ref 1.4–7.0)
Neutrophils: 54 %
Platelets: 290 x10E3/uL (ref 150–450)
RBC: 4.81 x10E6/uL (ref 3.77–5.28)
RDW: 12.6 % (ref 11.7–15.4)
WBC: 6.1 x10E3/uL (ref 3.4–10.8)

## 2024-03-10 LAB — TSH+FREE T4
Free T4: 1.03 ng/dL (ref 0.82–1.77)
TSH: 1.95 u[IU]/mL (ref 0.450–4.500)

## 2024-03-12 DIAGNOSIS — R197 Diarrhea, unspecified: Secondary | ICD-10-CM | POA: Diagnosis not present

## 2024-03-14 ENCOUNTER — Ambulatory Visit: Payer: Self-pay | Admitting: Family Medicine

## 2024-03-14 ENCOUNTER — Other Ambulatory Visit: Payer: Self-pay | Admitting: Family Medicine

## 2024-03-14 DIAGNOSIS — R197 Diarrhea, unspecified: Secondary | ICD-10-CM

## 2024-03-14 LAB — GI PROFILE, STOOL, PCR

## 2024-03-22 ENCOUNTER — Encounter: Payer: Self-pay | Admitting: Gastroenterology

## 2024-03-27 DIAGNOSIS — Z1231 Encounter for screening mammogram for malignant neoplasm of breast: Secondary | ICD-10-CM | POA: Diagnosis not present

## 2024-03-27 LAB — HM MAMMOGRAPHY

## 2024-04-06 ENCOUNTER — Encounter: Payer: Self-pay | Admitting: Internal Medicine

## 2024-04-06 ENCOUNTER — Ambulatory Visit: Admitting: Internal Medicine

## 2024-04-06 VITALS — BP 114/79 | HR 73 | Ht 62.0 in | Wt 156.0 lb

## 2024-04-06 DIAGNOSIS — I10 Essential (primary) hypertension: Secondary | ICD-10-CM

## 2024-04-06 DIAGNOSIS — F411 Generalized anxiety disorder: Secondary | ICD-10-CM

## 2024-04-06 DIAGNOSIS — Z0001 Encounter for general adult medical examination with abnormal findings: Secondary | ICD-10-CM | POA: Insufficient documentation

## 2024-04-06 DIAGNOSIS — E559 Vitamin D deficiency, unspecified: Secondary | ICD-10-CM

## 2024-04-06 DIAGNOSIS — F3341 Major depressive disorder, recurrent, in partial remission: Secondary | ICD-10-CM | POA: Insufficient documentation

## 2024-04-06 DIAGNOSIS — E782 Mixed hyperlipidemia: Secondary | ICD-10-CM

## 2024-04-06 DIAGNOSIS — R739 Hyperglycemia, unspecified: Secondary | ICD-10-CM

## 2024-04-06 DIAGNOSIS — R197 Diarrhea, unspecified: Secondary | ICD-10-CM

## 2024-04-06 MED ORDER — LOSARTAN POTASSIUM 25 MG PO TABS: 25.0000 mg | ORAL_TABLET | Freq: Every day | ORAL | 3 refills | Status: AC

## 2024-04-06 NOTE — Assessment & Plan Note (Signed)
 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

## 2024-04-06 NOTE — Assessment & Plan Note (Signed)
 Physical exam as documented. Fasting blood tests today. Advised to get Shingrix and pneumococcal vaccines, but she prefers to wait for now.

## 2024-04-06 NOTE — Assessment & Plan Note (Signed)
 Well-controlled with Prozac  20 mg QD, refilled

## 2024-04-06 NOTE — Assessment & Plan Note (Signed)
 Flowsheet Row Office Visit from 04/06/2024 in [facility]  PHQ-9 Total Score 0   Well-controlled with Prozac  20 mg QD, refilled

## 2024-04-06 NOTE — Addendum Note (Signed)
 Addended byBETHA TOBIE DOWNS on: 04/06/2024 06:00 PM   Modules accepted: Level of Service

## 2024-04-06 NOTE — Assessment & Plan Note (Addendum)
 BP Readings from Last 1 Encounters:  04/06/24 114/79   Well-controlled with Losartan 25 mg QD, refilled Counseled for compliance with the medications Check CMP Advised DASH diet and moderate exercise/walking, at least 150 mins/week

## 2024-04-06 NOTE — Progress Notes (Addendum)
 Established Patient Office Visit  Subjective:  Patient ID: Samantha Vincent, female    DOB: 12/27/1965  Age: 58 y.o. MRN: [id number]  CC:  Chief Complaint  Patient presents with   Medical Management of Chronic Issues    Follow up    HPI Samantha Vincent is a 58 y.o. female with past medical history of HTN, MDD and GAD who presents for f/u of her chronic medical conditions.  She is a previous patient of Dr. Melvenia.  HTN: Her BP is WNL today.  She reports having elevated BP at home in the last week, when she had run out of her losartan.  She had to borrow medicine from her mother.  She was taking losartan 25 mg QD.  Denies any episode of headache, dizziness, chest pain, dyspnea or palpitations currently.  MDD and GAD: She reports adequate response with Prozac  20 mg QD.  Denies any recent worsening of anxiety, anhedonia, SI or HI currently.  Diarrhea: She reports having diarrhea for the last 6 weeks.  Her GI stool profile was negative for any acute infection recently.  She reports improvement in the symptoms in the last week, but still has loose bowel movements.  Denies abdominal pain, vomiting, melena or hematochezia.  Past Medical History:  Diagnosis Date   Dry eye syndrome 07-30-11   tx. eye drops-Systane and Restasis   Fibroids, submucosal 01/13/2017   Gallstones    Mental disorder    anxiety/depression   PMB (postmenopausal bleeding) 01/13/2017    Past Surgical History:  Procedure Laterality Date   CHOLECYSTECTOMY  08/11/2011   Procedure: LAPAROSCOPIC CHOLECYSTECTOMY WITH INTRAOPERATIVE CHOLANGIOGRAM;  Surgeon: Elon CHRISTELLA Pacini, MD;  Location: [facility];  Service: General;  Laterality: N/A;  LAPAROSCOPIC CHOLECYSTECTOMY WITH INTRAOPERATIVE CHOLANGIOGRAM    COLONOSCOPY N/A 07/27/2018   Procedure: COLONOSCOPY;  Surgeon: Golda Claudis PENNER, MD;  Location: [facility];  Service: Endoscopy;  Laterality: N/A;  730   TONSILLECTOMY  1971   TONSILLECTOMY     WISDOM TOOTH EXTRACTION  07-30-11   in  dentist office    Family History  Problem Relation Age of Onset   Heart disease Father    Heart disease Maternal Grandfather     Social History   Socioeconomic History   Marital status: Married    Spouse name: Not on file   Number of children: 0   Years of education: Not on file   Highest education level: Not on file  Occupational History   Not on file  Tobacco Use   Smoking status: Former    Current packs/day: 0.00    Types: Cigarettes    Quit date: 07/29/2001    Years since quitting: 22.7   Smokeless tobacco: Never   Tobacco comments:    smoked in college  Vaping Use   Vaping status: Never Used  Substance and Sexual Activity   Alcohol use: Yes    Alcohol/week: 1.0 standard drink of alcohol    Types: 1 Glasses of wine per week    Comment: 1 glass wine every other day   Drug use: No   Sexual activity: Yes    Birth control/protection: None, Post-menopausal  Other Topics Concern   Not on file  Social History Narrative   Not on file   Social Drivers of Health   Financial Resource Strain: Low Risk  (05/07/2020)   Overall Financial Resource Strain (CARDIA)    Difficulty of Paying Living Expenses: Not hard at all  Food Insecurity: No Food  Insecurity (05/07/2020)   Hunger Vital Sign    Worried About Running Out of Food in the Last Year: Never true    Ran Out of Food in the Last Year: Never true  Transportation Needs: No Transportation Needs (05/07/2020)   PRAPARE - Administrator, Civil Service (Medical): No    Lack of Transportation (Non-Medical): No  Physical Activity: Inactive (05/07/2020)   Exercise Vital Sign    Days of Exercise per Week: 0 days    Minutes of Exercise per Session: 0 min  Stress: No Stress Concern Present (05/07/2020)   [employer] of Occupational Health - Occupational Stress Questionnaire    Feeling of Stress : Not at all  Social Connections: Socially Integrated (05/07/2020)   Social Connection and Isolation Panel     Frequency of Communication with Friends and Family: More than three times a week    Frequency of Social Gatherings with Friends and Family: More than three times a week    Attends Religious Services: More than 4 times per year    Active Member of [employer] or Organizations: Yes    Attends Engineer, structural: More than 4 times per year    Marital Status: Married  Catering manager Violence: Not At Risk (05/07/2020)   Humiliation, Afraid, Rape, and Kick questionnaire    Fear of Current or Ex-Partner: No    Emotionally Abused: No    Physically Abused: No    Sexually Abused: No    Outpatient Medications Prior to Visit  Medication Sig Dispense Refill   Cholecalciferol (VITAMIN D -3 PO) Take 5,000 Units by mouth.     fish oil-omega-3 fatty acids 1000 MG capsule Take 2 g by mouth daily.      FLUoxetine  (PROZAC ) 20 MG capsule TAKE ONE CAPSULE BY MOUTH ONCE DAILY. 30 capsule 12   losartan (COZAAR) 25 MG tablet Take 25 mg by mouth daily.     No facility-administered medications prior to visit.    No Known Allergies  ROS Review of Systems  Constitutional:  Negative for chills and fever.  HENT:  Negative for congestion, sinus pressure, sinus pain and sore throat.   Eyes:  Negative for pain and discharge.  Respiratory:  Negative for cough and shortness of breath.   Cardiovascular:  Negative for chest pain and palpitations.  Gastrointestinal:  Positive for diarrhea. Negative for abdominal pain, nausea and vomiting.  Endocrine: Negative for polydipsia and polyuria.  Genitourinary:  Negative for dysuria and hematuria.  Musculoskeletal:  Negative for neck pain and neck stiffness.  Skin:  Negative for rash.  Neurological:  Negative for dizziness and weakness.  Psychiatric/Behavioral:  Negative for agitation and behavioral problems.       Objective:    Physical Exam Vitals reviewed.  Constitutional:      General: She is not in acute distress.    Appearance: She is not diaphoretic.   HENT:     Head: Normocephalic and atraumatic.     Nose: Nose normal. No congestion.     Mouth/Throat:     Mouth: Mucous membranes are moist.     Pharynx: No posterior oropharyngeal erythema.  Eyes:     General: No scleral icterus.    Extraocular Movements: Extraocular movements intact.  Cardiovascular:     Rate and Rhythm: Normal rate and regular rhythm.     Heart sounds: Normal heart sounds. No murmur heard. Pulmonary:     Breath sounds: Normal breath sounds. No wheezing or rales.  Abdominal:  Palpations: Abdomen is soft.     Tenderness: There is no abdominal tenderness.  Musculoskeletal:     Cervical back: Neck supple. No tenderness.     Right lower leg: No edema.     Left lower leg: No edema.  Skin:    General: Skin is warm.     Findings: No rash.  Neurological:     General: No focal deficit present.     Mental Status: She is alert and oriented to person, place, and time.  Psychiatric:        Mood and Affect: Mood normal.        Behavior: Behavior normal.     BP 114/79   Pulse 73   Ht 5' 2 (1.575 m)   Wt 156 lb (70.8 kg)   LMP 05/13/2015   SpO2 97%   BMI 28.53 kg/m  Wt Readings from Last 3 Encounters:  04/06/24 156 lb (70.8 kg)  06/20/23 164 lb 3.2 oz (74.5 kg)  05/07/20 163 lb (73.9 kg)    Lab Results  Component Value Date   TSH 1.950 03/09/2024   Lab Results  Component Value Date   WBC 6.1 03/09/2024   HGB 14.1 03/09/2024   HCT 43.5 03/09/2024   MCV 90 03/09/2024   PLT 290 03/09/2024   Lab Results  Component Value Date   NA 134 03/09/2024   K 5.2 03/09/2024   CO2 21 03/09/2024   GLUCOSE 89 03/09/2024   BUN 18 03/09/2024   CREATININE 0.84 03/09/2024   BILITOT 0.3 03/09/2024   ALKPHOS 100 03/09/2024   AST 17 03/09/2024   ALT 15 03/09/2024   PROT 7.3 03/09/2024   ALBUMIN 4.3 03/09/2024   CALCIUM 9.4 03/09/2024   EGFR 80 03/09/2024   Lab Results  Component Value Date   CHOL 191 06/20/2023   Lab Results  Component Value Date    HDL 65 06/20/2023   Lab Results  Component Value Date   LDLCALC 110 (H) 06/20/2023   Lab Results  Component Value Date   TRIG 89 06/20/2023   Lab Results  Component Value Date   CHOLHDL 2.9 06/20/2023   Lab Results  Component Value Date   HGBA1C 5.5 06/20/2023      Assessment & Plan:   Problem List Items Addressed This Visit       Cardiovascular and Mediastinum   Essential hypertension   BP Readings from Last 1 Encounters:  04/06/24 114/79   Well-controlled with Losartan 25 mg QD, refilled Counseled for compliance with the medications Check CMP Advised DASH diet and moderate exercise/walking, at least 150 mins/week      Relevant Medications   losartan (COZAAR) 25 MG tablet   Other Relevant Orders   CMP14+EGFR   CBC with Differential/Platelet     Other   GAD (generalized anxiety disorder)   Well-controlled with Prozac  20 mg QD, refilled      Relevant Medications   FLUoxetine  (PROZAC ) 20 MG capsule   Other Relevant Orders   TSH   Diarrhea   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 residual symptoms from recent acute gastroenteritis Advised to take Imodium as needed for diarrhea Advised to try probiotics for few weeks Follow-up with GI       Recurrent major depressive disorder, in partial remission   Flowsheet Row Office Visit from 04/06/2024 in [facility]  PHQ-9 Total Score 0   Well-controlled with Prozac  20 mg QD, refilled      Relevant Medications   FLUoxetine  (  PROZAC ) 20 MG capsule   Other Relevant Orders   TSH   CMP14+EGFR   Encounter for general adult medical examination with abnormal findings - Primary   Physical exam as documented. Fasting blood tests today. Advised to get Shingrix and pneumococcal vaccines, but she prefers to wait for now.      Other Visit Diagnoses       Vitamin D  deficiency       Relevant Orders   VITAMIN D  25 Hydroxy (Vit-D Deficiency, Fractures)     Mixed hyperlipidemia       Relevant Medications    losartan (COZAAR) 25 MG tablet   Other Relevant Orders   Lipid panel     Hyperglycemia       Relevant Orders   Hemoglobin A1c   CMP14+EGFR       Meds ordered this encounter  Medications   losartan (COZAAR) 25 MG tablet    Sig: Take 1 tablet (25 mg total) by mouth daily.    Dispense:  90 tablet    Refill:  3   FLUoxetine  (PROZAC ) 20 MG capsule    Sig: Take 1 capsule (20 mg total) by mouth daily.    Dispense:  90 capsule    Refill:  3    Follow-up: Return in about 6 months (around 10/05/2024) for HTN and GAD.    Suzzane MARLA Blanch, MD

## 2024-04-06 NOTE — Patient Instructions (Signed)
 Please continue to take medications as prescribed.  Please continue to follow low carb diet and perform moderate exercise/walking at least 150 mins/week.  Please get fasting blood tests done before the next visit.  Please start taking Probiotics once daily.

## 2024-05-01 ENCOUNTER — Ambulatory Visit: Admitting: Gastroenterology

## 2024-05-01 ENCOUNTER — Encounter: Payer: Self-pay | Admitting: Gastroenterology

## 2024-05-01 VITALS — BP 132/88 | HR 68 | Temp 98.3°F | Ht 62.0 in | Wt 160.4 lb

## 2024-05-01 DIAGNOSIS — K760 Fatty (change of) liver, not elsewhere classified: Secondary | ICD-10-CM | POA: Diagnosis not present

## 2024-05-01 DIAGNOSIS — R197 Diarrhea, unspecified: Secondary | ICD-10-CM | POA: Diagnosis not present

## 2024-05-01 NOTE — Progress Notes (Unsigned)
 GI Office Note    Referring Provider: Del Orbe Polanco, Ilian* Primary Care Physician:  Tobie Suzzane POUR, MD  Primary Gastroenterologist:  Chief Complaint   Chief Complaint  Patient presents with   Diarrhea    Was having issue with diarrhea for about two months that has since resolved aprox. A week ago.     History of Present Illness   Samantha Vincent is a 58 y.o. female presenting today at the request of Hilario Terry Wilhelmena Lloyd, FNP for diarrhea.   Discussed the use of AI scribe software for clinical note transcription with the patient, who gave verbal consent to proceed.  History of Present Illness   Samantha Vincent is a 58 year old female who presents with a history of chronic diarrhea.  She has experienced diarrhea for about two months, which resolved approximately one and a half weeks ago. The diarrhea was described as 'pure liquid' without significant cramping or pain. Initially, it was frequent, occurring both at night and during the day, but gradually became less frequent, primarily occurring in the mornings before resolving completely. No weight loss or changes in medication were noted during this period. No upper GI symptoms.   A stool test for common pathogens was negative. Milk and milk products seemed to exacerbate her symptoms, although she typically tolerates gluten-containing foods well. She has a niece with history of celiac disease.   Her past medical history includes gallstones and a cholecystectomy. She was previously diagnosed with nonalcoholic fatty liver disease, which was noted on u/s in 2009.   Wt Readings from Last 3 Encounters:  05/01/24 160 lb 6.4 oz (72.8 kg)  04/06/24 156 lb (70.8 kg)  06/20/23 164 lb 3.2 oz (74.5 kg)       Prior Data   02/2024: GI profile negative  Colonoscopy 07/2018: -examined portion of ileum normal -colon normal -external hemorrhoids  Medications   Current Outpatient Medications  Medication Sig Dispense Refill    Cholecalciferol (VITAMIN D -3 PO) Take 5,000 Units by mouth daily.     fish oil-omega-3 fatty acids 1000 MG capsule Take 2 g by mouth daily.      FLUoxetine  (PROZAC ) 20 MG capsule Take 1 capsule (20 mg total) by mouth daily. 90 capsule 3   losartan (COZAAR) 25 MG tablet Take 1 tablet (25 mg total) by mouth daily. 90 tablet 3   Multiple Vitamins-Minerals (CENTRUM SILVER 50+WOMEN) TABS Take 1 tablet by mouth daily.     No current facility-administered medications for this visit.    Allergies   Allergies as of 05/01/2024   (No Known Allergies)    Past Medical History   Past Medical History:  Diagnosis Date   Dry eye syndrome 07-30-11   tx. eye drops-Systane and Restasis   Fibroids, submucosal 01/13/2017   Gallstones    Mental disorder    anxiety/depression   PMB (postmenopausal bleeding) 01/13/2017    Past Surgical History   Past Surgical History:  Procedure Laterality Date   CHOLECYSTECTOMY  08/11/2011   Procedure: LAPAROSCOPIC CHOLECYSTECTOMY WITH INTRAOPERATIVE CHOLANGIOGRAM;  Surgeon: Elon CHRISTELLA Pacini, MD;  Location: [facility];  Service: General;  Laterality: N/A;  LAPAROSCOPIC CHOLECYSTECTOMY WITH INTRAOPERATIVE CHOLANGIOGRAM    COLONOSCOPY N/A 07/27/2018   Procedure: COLONOSCOPY;  Surgeon: Golda Claudis PENNER, MD;  Location: [facility];  Service: Endoscopy;  Laterality: N/A;  730   TONSILLECTOMY  1971   TONSILLECTOMY     WISDOM TOOTH EXTRACTION  07-30-11   in dentist office  Past Family History   Family History  Problem Relation Age of Onset   Heart disease Father    Heart disease Maternal Grandfather     Past Social History   Social History   Socioeconomic History   Marital status: Married    Spouse name: Not on file   Number of children: 0   Years of education: Not on file   Highest education level: Not on file  Occupational History   Not on file  Tobacco Use   Smoking status: Former    Current packs/day: 0.00    Types: Cigarettes    Quit date: 07/29/2001     Years since quitting: 22.7   Smokeless tobacco: Never   Tobacco comments:    smoked in college  Vaping Use   Vaping status: Never Used  Substance and Sexual Activity   Alcohol use: Yes    Alcohol/week: 1.0 standard drink of alcohol    Types: 1 Glasses of wine per week    Comment: 1 glass wine every other day   Drug use: No   Sexual activity: Yes    Birth control/protection: None, Post-menopausal  Other Topics Concern   Not on file  Social History Narrative   Not on file   Social Drivers of Health   Financial Resource Strain: Low Risk  (05/07/2020)   Overall Financial Resource Strain (CARDIA)    Difficulty of Paying Living Expenses: Not hard at all  Food Insecurity: No Food Insecurity (05/07/2020)   Hunger Vital Sign    Worried About Running Out of Food in the Last Year: Never true    Ran Out of Food in the Last Year: Never true  Transportation Needs: No Transportation Needs (05/07/2020)   PRAPARE - Administrator, Civil Service (Medical): No    Lack of Transportation (Non-Medical): No  Physical Activity: Inactive (05/07/2020)   Exercise Vital Sign    Days of Exercise per Week: 0 days    Minutes of Exercise per Session: 0 min  Stress: No Stress Concern Present (05/07/2020)   [employer] of Occupational Health - Occupational Stress Questionnaire    Feeling of Stress : Not at all  Social Connections: Socially Integrated (05/07/2020)   Social Connection and Isolation Panel    Frequency of Communication with Friends and Family: More than three times a week    Frequency of Social Gatherings with Friends and Family: More than three times a week    Attends Religious Services: More than 4 times per year    Active Member of [employer] or Organizations: Yes    Attends Engineer, Structural: More than 4 times per year    Marital Status: Married  Catering Manager Violence: Not At Risk (05/07/2020)   Humiliation, Afraid, Rape, and Kick questionnaire    Fear of  Current or Ex-Partner: No    Emotionally Abused: No    Physically Abused: No    Sexually Abused: No    Review of Systems   General: Negative for anorexia, weight loss, fever, chills, fatigue, weakness. Eyes: Negative for vision changes.  ENT: Negative for hoarseness, difficulty swallowing , nasal congestion. CV: Negative for chest pain, angina, palpitations, dyspnea on exertion, peripheral edema.  Respiratory: Negative for dyspnea at rest, dyspnea on exertion, cough, sputum, wheezing.  GI: See history of present illness. GU:  Negative for dysuria, hematuria, urinary incontinence, urinary frequency, nocturnal urination.  MS: Negative for joint pain, low back pain.  Derm: Negative for rash or itching.  Neuro: Negative for weakness, abnormal sensation, seizure, frequent headaches, memory loss,  confusion.  Psych: Negative for anxiety, depression, suicidal ideation, hallucinations.  Endo: Negative for unusual weight change.  Heme: Negative for bruising or bleeding. Allergy: Negative for rash or hives.  Physical Exam   BP 132/88 (BP Location: Right Arm, Patient Position: Sitting, Cuff Size: Normal)   Pulse 68   Temp 98.3 F (36.8 C) (Oral)   Ht 5' 2 (1.575 m)   Wt 160 lb 6.4 oz (72.8 kg)   LMP 05/13/2015   SpO2 99%   BMI 29.34 kg/m    General: Well-nourished, well-developed in no acute distress.  Head: Normocephalic, atraumatic.   Eyes: Conjunctiva pink, no icterus. Mouth: Oropharyngeal mucosa moist and pink  Neck: Supple without thyromegaly, masses, or lymphadenopathy.  Lungs: Clear to auscultation bilaterally.  Heart: Regular rate and rhythm, no murmurs rubs or gallops.  Abdomen: Bowel sounds are normal, nontender, nondistended, no hepatosplenomegaly or masses,  no abdominal bruits or hernia, no rebound or guarding.   Rectal: not performed Extremities: No lower extremity edema. No clubbing or deformities.  Neuro: Alert and oriented x 4 , grossly normal neurologically.   Skin: Warm and dry, no rash or jaundice.   Psych: Alert and cooperative, normal mood and affect.  Labs   Lab Results  Component Value Date   TSH 1.950 03/09/2024   Lab Results  Component Value Date   NA 134 03/09/2024   CL 100 03/09/2024   K 5.2 03/09/2024   CO2 21 03/09/2024   BUN 18 03/09/2024   CREATININE 0.84 03/09/2024   EGFR 80 03/09/2024   CALCIUM 9.4 03/09/2024   ALBUMIN 4.3 03/09/2024   GLUCOSE 89 03/09/2024   Lab Results  Component Value Date   ALT 15 03/09/2024   AST 17 03/09/2024   ALKPHOS 100 03/09/2024   BILITOT 0.3 03/09/2024   Lab Results  Component Value Date   WBC 6.1 03/09/2024   HGB 14.1 03/09/2024   HCT 43.5 03/09/2024   MCV 90 03/09/2024   PLT 290 03/09/2024    Imaging Studies   No results found.  Assessment/Plan:    Assessment and Plan    Chronic diarrhea, resolved Chronic diarrhea for two months, now resolved. No cramping or significant pain. Stool tests for common pathogens were negative. Possible post-infectious irritable bowel syndrome considered. No family history of Crohn's, ulcerative colitis, or celiac disease. No weight loss or significant dietary changes. Dairy products may have exacerbated symptoms. - Monitor symptoms and report if diarrhea recurs. - Avoid dairy products during episodes of diarrhea.  Nonalcoholic fatty liver disease Diagnosed in 2009. Current labs from September show a FIB-4 score of 0.79, indicating low risk of advanced fibrosis. No family history of cirrhosis. Lifestyle management is the primary approach. Discussed potential progression and available medications if fibrosis risk increases. - Provided information on lifestyle management for fatty liver disease. - Will schedule follow-up in one year to reassess liver status and consider fibro scan and ELF score if indicated.  Suspected lactose intolerance Symptoms of diarrhea exacerbated by dairy products, suggesting lactose intolerance. Formal diagnosis  made. - Avoid dairy products to manage symptoms.  Recording duration: 13 minutes            Sonny RAMAN. Ezzard, MHS, PA-C [facility]

## 2024-05-01 NOTE — Patient Instructions (Signed)
 Monitor for recurrent diarrhea. If you have any issues, please reach out by phone or Mychart message.  With regards to history of fatty liver, your calculated FIB-4 score today was favorable, indicating either Fibrosis stage 0-1. Would recommend ongoing monitoring.   For management of fatty liver, lifestyle modifications are advised.  -consume low fat, low carb diet. Mediterranean diet is best.  -consume plenty of protein, goal of 80-90 grams at minimum, concentrating on plant based proteins -limit alcohol to rare use -exercise daily, goal of 30-45 minutes daily. Try to get at minimum, 10,000 steps daily. -drink 2 cups of coffee (with caffeine) daily, limit creamer and sugar. -you should have your FIB-4 score checked at least once year. We will be happy to see you in one year to follow up on your fatty liver.

## 2024-05-29 ENCOUNTER — Encounter: Payer: Self-pay | Admitting: Emergency Medicine

## 2024-05-29 ENCOUNTER — Other Ambulatory Visit: Payer: Self-pay

## 2024-05-29 ENCOUNTER — Ambulatory Visit
Admission: EM | Admit: 2024-05-29 | Discharge: 2024-05-29 | Disposition: A | Discharge status: Home / Self Care | Attending: Family Medicine | Admitting: Family Medicine

## 2024-05-29 DIAGNOSIS — J0100 Acute maxillary sinusitis, unspecified: Secondary | ICD-10-CM | POA: Diagnosis not present

## 2024-05-29 MED ORDER — AMOXICILLIN-POT CLAVULANATE 875-125 MG PO TABS: 1.0000 | ORAL_TABLET | Freq: Two times a day (BID) | ORAL | 0 refills | Status: AC

## 2024-05-29 MED ORDER — AZELASTINE HCL 0.1 % NA SOLN: 1.0000 | Freq: Two times a day (BID) | NASAL | 0 refills | Status: AC

## 2024-05-29 NOTE — ED Triage Notes (Signed)
 Pt reports cough x7 days, reports improved x1 day and reports cough is now productive and headache since this am. Denies any known fevers.

## 2024-05-29 NOTE — Discharge Instructions (Signed)
 In addition to the prescribed medications, you may take Coricidin HBP, plain Mucinex, use saline sinus rinses, humidifiers, Tylenol  as needed.  Follow-up for worsening or unresolving symptoms.

## 2024-05-29 NOTE — ED Provider Notes (Signed)
 [facility]    CSN: [id number] Arrival date & time: 05/29/24  1344      History   Chief Complaint Chief Complaint  Patient presents with   Cough    HPI Samantha Vincent is a 58 y.o. female.   Patient presenting today with 1 week history of progressively worsening productive cough, headache, nasal congestion and now facial pain and pressure, fatigue, weakness.  Denies chest pain, shortness of breath, abdominal pain, vomiting, diarrhea.  So far not trying anything over-the-counter for symptoms.  No known pertinent chronic medical problems.    Past Medical History:  Diagnosis Date   Dry eye syndrome 07-30-11   tx. eye drops-Systane and Restasis   Fibroids, submucosal 01/13/2017   Gallstones    Mental disorder    anxiety/depression   PMB (postmenopausal bleeding) 01/13/2017    Patient Active Problem List   Diagnosis Date Noted   Fatty liver 05/01/2024   Recurrent major depressive disorder, in partial remission 04/06/2024   Encounter for general adult medical examination with abnormal findings 04/06/2024   Diarrhea 03/08/2024   Essential hypertension 06/20/2023   Obesity (BMI 30.0-34.9) 06/20/2023   PMB (postmenopausal bleeding) 01/13/2017   Fibroids, submucosal 01/13/2017   GAD (generalized anxiety disorder) 05/13/2014   Gallstones 06/28/2011    Past Surgical History:  Procedure Laterality Date   CHOLECYSTECTOMY  08/11/2011   Procedure: LAPAROSCOPIC CHOLECYSTECTOMY WITH INTRAOPERATIVE CHOLANGIOGRAM;  Surgeon: Elon CHRISTELLA Pacini, MD;  Location: [facility];  Service: General;  Laterality: N/A;  LAPAROSCOPIC CHOLECYSTECTOMY WITH INTRAOPERATIVE CHOLANGIOGRAM    COLONOSCOPY N/A 07/27/2018   Procedure: COLONOSCOPY;  Surgeon: Golda Claudis PENNER, MD;  Location: [facility];  Service: Endoscopy;  Laterality: N/A;  730   TONSILLECTOMY  1971   TONSILLECTOMY     WISDOM TOOTH EXTRACTION  07-30-11   in dentist office    OB History     Gravida  0   Para  0   Term  0    Preterm  0   AB  0   Living  0      SAB  0   IAB  0   Ectopic  0   Multiple  0   Live Births               Home Medications    Prior to Admission medications   Medication Sig Start Date End Date Taking? Authorizing Provider  amoxicillin -clavulanate (AUGMENTIN ) 875-125 MG tablet Take 1 tablet by mouth every 12 (twelve) hours. 05/29/24  Yes Stuart Vernell Norris, PA-C  azelastine  (ASTELIN ) 0.1 % nasal spray Place 1 spray into both nostrils 2 (two) times daily. Use in each nostril as directed 05/29/24  Yes Stuart Vernell Norris, PA-C  Cholecalciferol (VITAMIN D -3 PO) Take 5,000 Units by mouth daily.    [provider]  fish oil-omega-3 fatty acids 1000 MG capsule Take 2 g by mouth daily.     [provider]  FLUoxetine  (PROZAC ) 20 MG capsule Take 1 capsule (20 mg total) by mouth daily. 04/06/24   Tobie Suzzane POUR, MD  losartan (COZAAR) 25 MG tablet Take 1 tablet (25 mg total) by mouth daily. 04/06/24   Tobie Suzzane POUR, MD  Multiple Vitamins-Minerals (CENTRUM SILVER 50+WOMEN) TABS Take 1 tablet by mouth daily.    [provider]    Family History Family History  Problem Relation Age of Onset   Heart disease Father    Heart disease Maternal Grandfather     Social History Social History  Tobacco Use   Smoking status: Former    Current packs/day: 0.00    Types: Cigarettes    Quit date: 07/29/2001    Years since quitting: 22.8   Smokeless tobacco: Never   Tobacco comments:    smoked in college  Vaping Use   Vaping status: Never Used  Substance Use Topics   Alcohol use: Yes    Alcohol/week: 1.0 standard drink of alcohol    Types: 1 Glasses of wine per week    Comment: 1 glass wine every other day   Drug use: No     Allergies   Patient has no known allergies.   Review of Systems Review of Systems PER HPI  Physical Exam Triage Vital Signs ED Triage Vitals  Encounter Vitals Group     BP 05/29/24 1353 125/84     Girls  Systolic BP Percentile --      Girls Diastolic BP Percentile --      Boys Systolic BP Percentile --      Boys Diastolic BP Percentile --      Pulse Rate 05/29/24 1353 67     Resp 05/29/24 1353 20     Temp 05/29/24 1353 97.8 F (36.6 C)     Temp Source 05/29/24 1353 Oral     SpO2 05/29/24 1353 95 %     Weight --      Height --      Head Circumference --      Peak Flow --      Pain Score 05/29/24 1352 6     Pain Loc --      Pain Education --      Exclude from Growth Chart --    No data found.  Updated Vital Signs BP 125/84 (BP Location: Right Arm)   Pulse 67   Temp 97.8 F (36.6 C) (Oral)   Resp 20   LMP 05/13/2015   SpO2 95%   Visual Acuity Right Eye Distance:   Left Eye Distance:   Bilateral Distance:    Right Eye Near:   Left Eye Near:    Bilateral Near:     Physical Exam Vitals and nursing note reviewed.  Constitutional:      Appearance: Normal appearance.  HENT:     Head: Atraumatic.     Right Ear: Tympanic membrane and external ear normal.     Left Ear: Tympanic membrane and external ear normal.     Nose: Congestion present.     Mouth/Throat:     Mouth: Mucous membranes are moist.     Pharynx: Posterior oropharyngeal erythema present.  Eyes:     Extraocular Movements: Extraocular movements intact.     Conjunctiva/sclera: Conjunctivae normal.  Cardiovascular:     Rate and Rhythm: Normal rate and regular rhythm.     Heart sounds: Normal heart sounds.  Pulmonary:     Effort: Pulmonary effort is normal.     Breath sounds: Normal breath sounds. No wheezing or rales.  Musculoskeletal:        General: Normal range of motion.     Cervical back: Normal range of motion and neck supple.  Skin:    General: Skin is warm and dry.  Neurological:     Mental Status: She is alert and oriented to person, place, and time.  Psychiatric:        Mood and Affect: Mood normal.        Thought Content: Thought content normal.      UC  Treatments / Results   Labs (all labs ordered are listed, but only abnormal results are displayed) Labs Reviewed - No data to display  EKG   Radiology No results found.  Procedures Procedures (including critical care time)  Medications Ordered in UC Medications - No data to display  Initial Impression / Assessment and Plan / UC Course  I have reviewed the triage vital signs and the nursing notes.  Pertinent labs & imaging results that were available during my care of the patient were reviewed by me and considered in my medical decision making (see chart for details).     Vitals and exam overall reassuring today but given duration worsening course of symptoms will treat with Augmentin , Astelin , supportive over-the-counter medications and home care.  Return for worsening or unresolving symptoms.  Final Clinical Impressions(s) / UC Diagnoses   Final diagnoses:  Acute non-recurrent maxillary sinusitis     Discharge Instructions      In addition to the prescribed medications, you may take Coricidin HBP, plain Mucinex, use saline sinus rinses, humidifiers, Tylenol  as needed.  Follow-up for worsening or unresolving symptoms.    ED Prescriptions     Medication Sig Dispense Auth. Provider   amoxicillin -clavulanate (AUGMENTIN ) 875-125 MG tablet Take 1 tablet by mouth every 12 (twelve) hours. 14 tablet Stuart Vernell Norris, PA-C   azelastine  (ASTELIN ) 0.1 % nasal spray Place 1 spray into both nostrils 2 (two) times daily. Use in each nostril as directed 30 mL Stuart Vernell Norris, PA-C      PDMP not reviewed this encounter.   Stuart Vernell Norris, PA-C 05/29/24 1433

## 2024-10-05 ENCOUNTER — Ambulatory Visit: Admitting: Internal Medicine
